# Patient Record
Sex: Male | Born: 1950
Health system: Southern US, Community
[De-identification: ages and names within clinical notes are randomized; demographics above are authoritative.]

## PROBLEM LIST (undated history)

## (undated) DIAGNOSIS — T8859XA Other complications of anesthesia, initial encounter: Secondary | ICD-10-CM

## (undated) DIAGNOSIS — N400 Enlarged prostate without lower urinary tract symptoms: Secondary | ICD-10-CM

## (undated) DIAGNOSIS — E7800 Pure hypercholesterolemia, unspecified: Secondary | ICD-10-CM

## (undated) DIAGNOSIS — H9319 Tinnitus, unspecified ear: Secondary | ICD-10-CM

## (undated) DIAGNOSIS — H43811 Vitreous degeneration, right eye: Secondary | ICD-10-CM

## (undated) DIAGNOSIS — G4733 Obstructive sleep apnea (adult) (pediatric): Secondary | ICD-10-CM

## (undated) DIAGNOSIS — J189 Pneumonia, unspecified organism: Secondary | ICD-10-CM

## (undated) DIAGNOSIS — Z87828 Personal history of other (healed) physical injury and trauma: Secondary | ICD-10-CM

## (undated) DIAGNOSIS — F988 Other specified behavioral and emotional disorders with onset usually occurring in childhood and adolescence: Secondary | ICD-10-CM

## (undated) DIAGNOSIS — G4730 Sleep apnea, unspecified: Secondary | ICD-10-CM

## (undated) DIAGNOSIS — M7541 Impingement syndrome of right shoulder: Secondary | ICD-10-CM

## (undated) DIAGNOSIS — I4891 Unspecified atrial fibrillation: Secondary | ICD-10-CM

## (undated) DIAGNOSIS — H9190 Unspecified hearing loss, unspecified ear: Secondary | ICD-10-CM

## (undated) DIAGNOSIS — R002 Palpitations: Secondary | ICD-10-CM

## (undated) DIAGNOSIS — N451 Epididymitis: Secondary | ICD-10-CM

## (undated) DIAGNOSIS — G5600 Carpal tunnel syndrome, unspecified upper limb: Secondary | ICD-10-CM

## (undated) DIAGNOSIS — Z9889 Other specified postprocedural states: Secondary | ICD-10-CM

## (undated) DIAGNOSIS — I499 Cardiac arrhythmia, unspecified: Secondary | ICD-10-CM

## (undated) HISTORY — DX: Tinnitus, unspecified ear: H93.19

## (undated) HISTORY — DX: Palpitations: R00.2

## (undated) HISTORY — DX: Other specified postprocedural states: Z98.890

## (undated) HISTORY — DX: Carpal tunnel syndrome, unspecified upper limb: G56.00

## (undated) HISTORY — DX: Other specified behavioral and emotional disorders with onset usually occurring in childhood and adolescence: F98.8

## (undated) HISTORY — DX: Sleep apnea, unspecified: G47.30

## (undated) HISTORY — DX: Pure hypercholesterolemia, unspecified: E78.00

## (undated) HISTORY — DX: Epididymitis: N45.1

## (undated) HISTORY — DX: Benign prostatic hyperplasia without lower urinary tract symptoms: N40.0

## (undated) HISTORY — DX: Unspecified atrial fibrillation: I48.91

## (undated) HISTORY — DX: Unspecified hearing loss, unspecified ear: H91.90

## (undated) HISTORY — DX: Vitreous degeneration, right eye: H43.811

## (undated) HISTORY — PX: OTHER SURGICAL HISTORY: SHX169

## (undated) HISTORY — DX: Impingement syndrome of right shoulder: M75.41

## (undated) HISTORY — DX: Personal history of other (healed) physical injury and trauma: Z87.828

---
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
## 1996-09-14 HISTORY — PX: HERNIA REPAIR: SHX51

## 1999-09-15 HISTORY — PX: HERNIA REPAIR: SHX51

## 1999-10-16 ENCOUNTER — Encounter (HOSPITAL_BASED_OUTPATIENT_CLINIC_OR_DEPARTMENT_OTHER): Payer: Self-pay | Admitting: General Surgery

## 1999-10-20 ENCOUNTER — Ambulatory Visit (HOSPITAL_COMMUNITY): Admission: RE | Admit: 1999-10-20 | Discharge: 1999-10-20 | Payer: Self-pay | Admitting: General Surgery

## 2001-03-02 ENCOUNTER — Encounter: Payer: Self-pay | Admitting: Emergency Medicine

## 2001-03-02 ENCOUNTER — Encounter (HOSPITAL_BASED_OUTPATIENT_CLINIC_OR_DEPARTMENT_OTHER): Payer: Self-pay | Admitting: General Surgery

## 2001-03-02 ENCOUNTER — Emergency Department (HOSPITAL_COMMUNITY): Admission: EM | Admit: 2001-03-02 | Discharge: 2001-03-02 | Payer: Self-pay | Admitting: Unknown Physician Specialty

## 2001-06-15 ENCOUNTER — Ambulatory Visit (HOSPITAL_COMMUNITY): Admission: RE | Admit: 2001-06-15 | Discharge: 2001-06-15 | Payer: Self-pay | Admitting: Gastroenterology

## 2004-08-14 ENCOUNTER — Ambulatory Visit: Payer: Self-pay | Admitting: Internal Medicine

## 2004-08-14 ENCOUNTER — Ambulatory Visit (HOSPITAL_BASED_OUTPATIENT_CLINIC_OR_DEPARTMENT_OTHER): Admission: RE | Admit: 2004-08-14 | Discharge: 2004-08-14 | Payer: Self-pay | Admitting: Internal Medicine

## 2004-10-02 ENCOUNTER — Ambulatory Visit (HOSPITAL_BASED_OUTPATIENT_CLINIC_OR_DEPARTMENT_OTHER): Admission: RE | Admit: 2004-10-02 | Discharge: 2004-10-02 | Payer: Self-pay | Admitting: Internal Medicine

## 2004-10-23 ENCOUNTER — Ambulatory Visit: Payer: Self-pay | Admitting: Internal Medicine

## 2011-08-15 HISTORY — PX: EYE SURGERY: SHX253

## 2011-08-17 ENCOUNTER — Encounter: Payer: Self-pay | Admitting: Internal Medicine

## 2011-09-09 NOTE — H&P (Signed)
George Gardner DOB: Oct 19, 1950   Chief Complaint: Right wrist pain  History of Present Illness The patient is a 60 year old male who presents today for follow up of their hand/wrist. The patient is being followed for their right wrist pain. Symptoms reported today include: pain, aching, weakness, numbness and burning. The patient feels that they are doing poorly and report their pain level to be moderate to severe. Current treatment includes: bracing and NSAIDs. The following medication has been used for pain control: antiinflammatory medication. The patient presents today following EMG/NCV. The patient has reported improvement of their symptoms with: bracing, conservative measures and NSAIDs. The patient indicates that they have questions or concerns today regarding pain. Note for "Follow-up Hand/Wrist": right and left wrists. His right wrist is the worse. He states he is dropping things all the time. He has weakness in his hands. He states he can push on a pressure point in the Rt. shoulder that will somewhat relieve some of the numbness in his first 3 fingers.   Family History Cerebrovascular Accident. mother Congestive Heart Failure. mother Heart Disease. mother   Social History Alcohol use. current drinker; drinks beer and wine; 8-14 per week Children. 2 Current work status. working full time Drug/Alcohol Rehab (Currently). no Drug/Alcohol Rehab (Previously). no Exercise. Exercises rarely; does other Illicit drug use. no Living situation. live with spouse Marital status. married Number of flights of stairs before winded. greater than 5 Pain Contract. no Tobacco use. former smoker   Medication History Ibuprofen (200MG  Capsule, 1 Oral as needed) Active.   Past Surgical History Inguinal Hernia Repair. open: left and multiple times Tonsillectomy   Other Problems Cardiac Arrhythmia Sleep Apnea   Review of Systems General:Not  Present- Chills, Fever, Night Sweats, Appetite Loss, Fatigue, Feeling sick, Weight Gain and Weight Loss. Skin:Not Present- Itching, Rash, Skin Color Changes, Ulcer, Psoriasis and Change in Hair or Nails. HEENT:Present- Sensitivity to light, Hearing problems and Ringing in the Ears. Not Present- Nose Bleed. Neck:Not Present- Swollen Glands and Neck Mass. Respiratory:Not Present- Snoring, Chronic Cough, Bloody sputum and Dyspnea. Cardiovascular:Present- Palpitations. Not Present- Shortness of Breath, Chest Pain, Swelling of Extremities and Leg Cramps. Gastrointestinal:Not Present- Bloody Stool, Heartburn, Abdominal Pain, Vomiting, Nausea and Incontinence of Stool. Male Genitourinary:Not Present- Blood in Urine, Frequency, Incontinence and Nocturia. Musculoskeletal:Not Present- Muscle Weakness, Muscle Pain, Joint Stiffness, Joint Swelling, Joint Pain and Back Pain. Neurological:Present- Tingling. Not Present- Numbness, Burning, Tremor, Headaches and Dizziness. Psychiatric:Not Present- Anxiety, Depression and Memory Loss. Endocrine:Not Present- Cold Intolerance, Heat Intolerance, Excessive hunger and Excessive Thirst. Hematology:Not Present- Abnormal Bleeding, Anemia, Blood Clots and Easy Bruising.   Vitals 08/20/2011 10:12 AM Weight: 166.13 lb Height: 70.5 in Body Surface Area: 1.94 m Body Mass Index: 23.5 kg/m Pulse: 84 (Regular) BP: 121/75 (Sitting, Left Arm, Standard)    Objective  Alert and oriented x 3. No acute distress. EOM intact. Cranial nerves grossly intact. Lungs clear to auscultation. Heart sounds normal. No murmurs. Regular, rate, and rhythm. Abdomen soft and nontender. On exam, he has atrophy of his right thenar eminencethat he does not have on the left and he is right handed. Numbness along the median nerve distribution. Decreased strength in the right compared to the left.   Dr. Darrelyn Hillock went back through his study from 2008, four years ago. He had  moderate to severe right carpal tunnel syndrome. He is able to get by he said without it, but now it is worse especially at night.   Assessment & Plan Syndrome,  carpal tunnel (354.0)   Plans  He needs a right carpal tunnel release. Dr. Darrelyn Hillock went over all the procedures on this and he needs to be put to sleep a short time, tourniquet. Complications are rare such as infection and recurrence of carpal tunnel. Dr. Darrelyn Hillock drew him a picture, showed him the book and explained the procedure.

## 2011-09-10 ENCOUNTER — Encounter (HOSPITAL_BASED_OUTPATIENT_CLINIC_OR_DEPARTMENT_OTHER): Payer: Self-pay | Admitting: *Deleted

## 2011-09-10 NOTE — Progress Notes (Signed)
To [facility] at 1045. Hg on arrival,will request resent Ekg at primary Md's office.npo after mn.

## 2011-09-11 ENCOUNTER — Encounter (HOSPITAL_BASED_OUTPATIENT_CLINIC_OR_DEPARTMENT_OTHER): Payer: Self-pay | Admitting: Anesthesiology

## 2011-09-11 ENCOUNTER — Ambulatory Visit (HOSPITAL_BASED_OUTPATIENT_CLINIC_OR_DEPARTMENT_OTHER)
Admission: RE | Admit: 2011-09-11 | Discharge: 2011-09-11 | Disposition: A | Discharge status: Home / Self Care | Payer: BC Managed Care – PPO | Source: Ambulatory Visit | Attending: Orthopedic Surgery | Admitting: Orthopedic Surgery

## 2011-09-11 ENCOUNTER — Encounter (HOSPITAL_BASED_OUTPATIENT_CLINIC_OR_DEPARTMENT_OTHER): Payer: Self-pay | Admitting: *Deleted

## 2011-09-11 ENCOUNTER — Ambulatory Visit (HOSPITAL_BASED_OUTPATIENT_CLINIC_OR_DEPARTMENT_OTHER): Payer: BC Managed Care – PPO | Admitting: Anesthesiology

## 2011-09-11 ENCOUNTER — Encounter (HOSPITAL_BASED_OUTPATIENT_CLINIC_OR_DEPARTMENT_OTHER)
Admission: RE | Disposition: A | Discharge status: Home / Self Care | Payer: Self-pay | Source: Ambulatory Visit | Attending: Orthopedic Surgery

## 2011-09-11 ENCOUNTER — Other Ambulatory Visit: Payer: Self-pay

## 2011-09-11 DIAGNOSIS — G5601 Carpal tunnel syndrome, right upper limb: Secondary | ICD-10-CM | POA: Diagnosis present

## 2011-09-11 DIAGNOSIS — M25539 Pain in unspecified wrist: Secondary | ICD-10-CM | POA: Insufficient documentation

## 2011-09-11 DIAGNOSIS — G4730 Sleep apnea, unspecified: Secondary | ICD-10-CM | POA: Insufficient documentation

## 2011-09-11 DIAGNOSIS — R209 Unspecified disturbances of skin sensation: Secondary | ICD-10-CM | POA: Insufficient documentation

## 2011-09-11 DIAGNOSIS — I499 Cardiac arrhythmia, unspecified: Secondary | ICD-10-CM | POA: Insufficient documentation

## 2011-09-11 DIAGNOSIS — G5600 Carpal tunnel syndrome, unspecified upper limb: Secondary | ICD-10-CM | POA: Insufficient documentation

## 2011-09-11 HISTORY — DX: Cardiac arrhythmia, unspecified: I49.9

## 2011-09-11 HISTORY — DX: Obstructive sleep apnea (adult) (pediatric): G47.33

## 2011-09-11 HISTORY — PX: CARPAL TUNNEL RELEASE: SHX101

## 2011-09-11 SURGERY — CARPAL TUNNEL RELEASE
Anesthesia: General | Site: Hand | Laterality: Right | Wound class: Clean

## 2011-09-11 MED ORDER — OXYCODONE-ACETAMINOPHEN 5-325 MG PO TABS
1.0000 | ORAL_TABLET | ORAL | Status: DC | PRN
  Administered 2011-09-11: 1 via ORAL

## 2011-09-11 MED ORDER — BACITRACIN-NEOMYCIN-POLYMYXIN 400-5-5000 EX OINT
TOPICAL_OINTMENT | CUTANEOUS | Status: DC | PRN
  Administered 2011-09-11: 1 via TOPICAL

## 2011-09-11 MED ORDER — LACTATED RINGERS IV SOLN
INTRAVENOUS | Status: DC
  Administered 2011-09-11: 100 mL/h via INTRAVENOUS

## 2011-09-11 MED ORDER — DEXAMETHASONE SODIUM PHOSPHATE 4 MG/ML IJ SOLN
INTRAMUSCULAR | Status: DC | PRN
  Administered 2011-09-11: 10 mg via INTRAVENOUS

## 2011-09-11 MED ORDER — PROPOFOL 10 MG/ML IV EMUL
INTRAVENOUS | Status: DC | PRN
  Administered 2011-09-11: 200 mg via INTRAVENOUS

## 2011-09-11 MED ORDER — MIDAZOLAM HCL 5 MG/5ML IJ SOLN
INTRAMUSCULAR | Status: DC | PRN
  Administered 2011-09-11 (×2): 1 mg via INTRAVENOUS

## 2011-09-11 MED ORDER — LACTATED RINGERS IV SOLN: INTRAVENOUS | Status: DC

## 2011-09-11 MED ORDER — KETOROLAC TROMETHAMINE 30 MG/ML IJ SOLN
INTRAMUSCULAR | Status: DC | PRN
  Administered 2011-09-11: 30 mg via INTRAVENOUS

## 2011-09-11 MED ORDER — FENTANYL CITRATE 0.05 MG/ML IJ SOLN
INTRAMUSCULAR | Status: DC | PRN
  Administered 2011-09-11 (×2): 25 ug via INTRAVENOUS
  Administered 2011-09-11: 75 ug via INTRAVENOUS
  Administered 2011-09-11: 25 ug via INTRAVENOUS
  Administered 2011-09-11: 50 ug via INTRAVENOUS

## 2011-09-11 MED ORDER — PROMETHAZINE HCL 25 MG/ML IJ SOLN: 6.2500 mg | INTRAMUSCULAR | Status: DC | PRN

## 2011-09-11 MED ORDER — LIDOCAINE HCL (CARDIAC) 20 MG/ML IV SOLN
INTRAVENOUS | Status: DC | PRN
  Administered 2011-09-11: 100 mg via INTRAVENOUS

## 2011-09-11 MED ORDER — ONDANSETRON HCL 4 MG/2ML IJ SOLN
INTRAMUSCULAR | Status: DC | PRN
  Administered 2011-09-11: 4 mg via INTRAVENOUS

## 2011-09-11 SURGICAL SUPPLY — 44 items
BANDAGE ELASTIC 3 VELCRO ST LF (GAUZE/BANDAGES/DRESSINGS) ×2 IMPLANT
BANDAGE GAUZE ELAST BULKY 4 IN (GAUZE/BANDAGES/DRESSINGS) ×2 IMPLANT
BENZOIN TINCTURE PRP APPL 2/3 (GAUZE/BANDAGES/DRESSINGS) IMPLANT
BLADE SURG 15 STRL LF DISP TIS (BLADE) ×2 IMPLANT
BLADE SURG 15 STRL SS (BLADE) ×2
BNDG ESMARK 4X9 LF (GAUZE/BANDAGES/DRESSINGS) ×2 IMPLANT
CLOTH BEACON ORANGE TIMEOUT ST (SAFETY) ×2 IMPLANT
CORDS BIPOLAR (ELECTRODE) ×2 IMPLANT
COVER MAYO STAND STRL (DRAPES) IMPLANT
COVER TABLE BACK 60X90 (DRAPES) ×2 IMPLANT
DRAPE EXTREMITY T 121X128X90 (DRAPE) ×2 IMPLANT
DRAPE LG THREE QUARTER DISP (DRAPES) ×2 IMPLANT
DRSG ADAPTIC 3X8 NADH LF (GAUZE/BANDAGES/DRESSINGS) ×2 IMPLANT
DRSG PAD ABDOMINAL 8X10 ST (GAUZE/BANDAGES/DRESSINGS) ×2 IMPLANT
DURAPREP 26ML APPLICATOR (WOUND CARE) ×2 IMPLANT
ELECT REM PT RETURN 9FT ADLT (ELECTROSURGICAL) ×2
ELECTRODE REM PT RTRN 9FT ADLT (ELECTROSURGICAL) ×1 IMPLANT
GAUZE KERLIX 2  STERILE LF (GAUZE/BANDAGES/DRESSINGS) ×2 IMPLANT
GLOVE BIO SURGEON STRL SZ8.5 (GLOVE) ×2 IMPLANT
GLOVE BIOGEL M 6.5 STRL (GLOVE) ×2 IMPLANT
GLOVE ECLIPSE 6.0 STRL STRAW (GLOVE) ×2 IMPLANT
GLOVE INDICATOR 8.5 STRL (GLOVE) ×2 IMPLANT
GLOVE SURG ORTHO 6.0 STRL STRW (GLOVE) ×2 IMPLANT
GOWN W/2 COTTON TOWELS 2 STD (GOWNS) ×2 IMPLANT
NEEDLE HYPO 22GX1.5 SAFETY (NEEDLE) ×2 IMPLANT
NS IRRIG 500ML POUR BTL (IV SOLUTION) ×2 IMPLANT
PACK BASIN DAY SURGERY FS (CUSTOM PROCEDURE TRAY) ×2 IMPLANT
PADDING CAST ABS 4INX4YD NS (CAST SUPPLIES) ×1
PADDING CAST ABS COTTON 4X4 ST (CAST SUPPLIES) ×1 IMPLANT
PENCIL BUTTON HOLSTER BLD 10FT (ELECTRODE) IMPLANT
SPONGE GAUZE 4X4 12PLY (GAUZE/BANDAGES/DRESSINGS) ×2 IMPLANT
STOCKINETTE 4X48 STRL (DRAPES) ×2 IMPLANT
STRIP CLOSURE SKIN 1/2X4 (GAUZE/BANDAGES/DRESSINGS) IMPLANT
SUT ETHILON 3 0 PS 1 (SUTURE) ×4 IMPLANT
SUT VIC AB 1 CT1 36 (SUTURE) IMPLANT
SUT VIC AB 3-0 FS2 27 (SUTURE) IMPLANT
SUT VIC AB 3-0 PS1 18 (SUTURE)
SUT VIC AB 3-0 PS1 18XBRD (SUTURE) IMPLANT
SUT VICRYL 4-0 PS2 18IN ABS (SUTURE) IMPLANT
SYR BULB IRRIGATION 50ML (SYRINGE) ×2 IMPLANT
SYR CONTROL 10ML LL (SYRINGE) IMPLANT
TOWEL OR 17X24 6PK STRL BLUE (TOWEL DISPOSABLE) ×4 IMPLANT
UNDERPAD 30X30 INCONTINENT (UNDERPADS AND DIAPERS) ×2 IMPLANT
WATER STERILE IRR 500ML POUR (IV SOLUTION) ×2 IMPLANT

## 2011-09-11 NOTE — Anesthesia Procedure Notes (Signed)
Procedure Name: LMA Insertion Date/Time: 09/11/2011 1:14 PM Performed by: Huel Coventry Pre-anesthesia Checklist: Patient identified, Emergency Drugs available, Suction available and Patient being monitored Patient Re-evaluated:Patient Re-evaluated prior to inductionOxygen Delivery Method: Circle System Utilized Preoxygenation: Pre-oxygenation with 100% oxygen Intubation Type: IV induction Ventilation: Mask ventilation without difficulty LMA: LMA inserted LMA Size: 4.0 Number of attempts: 1 Airway Equipment and Method: bite block Placement Confirmation: positive ETCO2 Tube secured with: Tape Dental Injury: Teeth and Oropharynx as per pre-operative assessment

## 2011-09-11 NOTE — Anesthesia Postprocedure Evaluation (Signed)
  Anesthesia Post-op Note  Patient: George Gardner  Procedure(s) Performed:  CARPAL TUNNEL RELEASE  Patient Location: PACU  Anesthesia Type: General  Level of Consciousness: awake and alert   Airway and Oxygen Therapy: Patient Spontanous Breathing  Post-op Pain: mild  Post-op Assessment: Post-op Vital signs reviewed, Patient's Cardiovascular Status Stable, Respiratory Function Stable, Patent Airway and No signs of Nausea or vomiting  Post-op Vital Signs: stable  Complications: No apparent anesthesia complications

## 2011-09-11 NOTE — H&P (View-Only) (Signed)
To [facility] at 1045. Hg on arrival,will request resent Ekg at primary Md's office.npo after mn. 

## 2011-09-11 NOTE — Interval H&P Note (Signed)
History and Physical Interval Note:  09/11/2011 1:01 PM  George Gardner  has presented today for surgery, with the diagnosis of RT CARPAL TUNNEL SYNDROME  The various methods of treatment have been discussed with the patient and family. After consideration of risks, benefits and other options for treatment, the patient has consented to  Procedure(s): CARPAL TUNNEL RELEASE as a surgical intervention .  The patients' history has been reviewed, patient examined, no change in status, stable for surgery.  I have reviewed the patients' chart and labs.  Questions were answered to the patient's satisfaction.     Erhardt Dada A

## 2011-09-11 NOTE — Brief Op Note (Signed)
09/11/2011  2:19 PM  PATIENT:  George Gardner  60 y.o. male  PRE-OPERATIVE DIAGNOSIS:  RT CARPAL TUNNEL SYNDROME  POST-OPERATIVE DIAGNOSIS:  right carpal tunnel syndrome  PROCEDURE:  Procedure(s): CARPAL TUNNEL RELEASE  SURGEON:  Surgeon(s): Eriko Economos A Calogero Geisen  PHYSICIAN ASSISTANT:   ASSISTANTS: Nurse   ANESTHESIA:   general  EBL:  Total I/O In: 800 [I.V.:800] Out: -   BLOOD ADMINISTERED:none  DRAINS: none   LOCAL MEDICATIONS USED:  NONE  SPECIMEN:  No Specimen  DISPOSITION OF SPECIMEN:  N/A  COUNTS:  YES  TOURNIQUET:   Total Tourniquet Time Documented: Upper Arm (Right) - 22 minutes  DICTATION: .Other Dictation: Dictation Number [id number]  PLAN OF CARE: Discharge to home after PACU  PATIENT DISPOSITION:  PACU - hemodynamically stable.   Delay start of Pharmacological VTE agent (>24hrs) due to surgical blood loss or risk of bleeding:  {YES/NO/NOT APPLICABLE:20182

## 2011-09-11 NOTE — Transfer of Care (Signed)
Immediate Anesthesia Transfer of Care Note  Patient: George Gardner  Procedure(s) Performed:  CARPAL TUNNEL RELEASE  Patient Location: PACU  Anesthesia Type: General  Level of Consciousness: patient drowsy, follows commands and arouses to name.  Airway & Oxygen Therapy: Patient Spontanous Breathing and Patient connected to face mask oxygen  Post-op Assessment: Report given to PACU RN and Post -op Vital signs reviewed and stable  Post vital signs: Reviewed and stable  Complications: No apparent anesthesia complications

## 2011-09-11 NOTE — Anesthesia Preprocedure Evaluation (Addendum)
Anesthesia Evaluation  Patient identified by MRN, date of birth, ID band Patient awake    Reviewed: Allergy & Precautions, H&P , NPO status , Patient's Chart, lab work & pertinent test results  Airway Mallampati: II TM Distance: >3 FB Neck ROM: full    Dental No notable dental hx. (+) Teeth Intact and Dental Advisory Given   Pulmonary neg pulmonary ROS, sleep apnea ,  clear to auscultation  Pulmonary exam normal       Cardiovascular Exercise Tolerance: Good neg cardio ROS Dysrhythmias: hereditary arrythmia if stressed out or too much caffeine.  irregular like A fib.  Hits himself in chest and it  stops. regular Normal    Neuro/Psych Negative Neurological ROS  Negative Psych ROS   GI/Hepatic negative GI ROS, Neg liver ROS,   Endo/Other  Negative Endocrine ROS  Renal/GU negative Renal ROS  Genitourinary negative   Musculoskeletal   Abdominal   Peds  Hematology negative hematology ROS (+)   Anesthesia Other Findings   Reproductive/Obstetrics negative OB ROS                          Anesthesia Physical Anesthesia Plan  ASA: III  Anesthesia Plan: General   Post-op Pain Management:    Induction: Intravenous  Airway Management Planned: LMA  Additional Equipment:   Intra-op Plan:   Post-operative Plan:   Informed Consent: I have reviewed the patients History and Physical, chart, labs and discussed the procedure including the risks, benefits and alternatives for the proposed anesthesia with the patient or authorized representative who has indicated his/her understanding and acceptance.   Dental Advisory Given  Plan Discussed with: CRNA and Surgeon  Anesthesia Plan Comments:         Anesthesia Quick Evaluation

## 2011-09-14 ENCOUNTER — Encounter (HOSPITAL_BASED_OUTPATIENT_CLINIC_OR_DEPARTMENT_OTHER): Payer: Self-pay | Admitting: Orthopedic Surgery

## 2011-09-14 LAB — POCT HEMOGLOBIN-HEMACUE: Hemoglobin: 14.4 g/dL (ref 13.0–17.0)

## 2011-09-16 NOTE — Op Note (Signed)
NAMEMARVELLE, CAUDILL               ACCOUNT NO.:  [id number]  MEDICAL RECORD NO.:  [id number]  LOCATION:                                 FACILITY:  PHYSICIAN:  Georges Lynch. Darrelyn Hillock, M.D.     DATE OF BIRTH:  DATE OF PROCEDURE:  09/11/2011 DATE OF DISCHARGE:                              OPERATIVE REPORT   SURGEON:  Georges Lynch. Darrelyn Hillock, MD  ASSISTANT:  Nurse.  PREOPERATIVE DIAGNOSIS:  Severe carpal tunnel syndrome on the right.  POSTOPERATIVE DIAGNOSES: 1. Severe carpal tunnel syndrome on the right. 2. Previous infection of the palm of his right hand in the 1990s.  He     has had no signs of any recurrent infection.  OPERATION:  Right carpal tunnel release.  PROCEDURE IN DETAIL:  Under general anesthesia, routine orthopedic prepping and draping of the right upper extremity was carried out.  The appropriate time-out was carried out prior to incision and I also marked the appropriate right arm in the holding area.  The right arm was exsanguinated with an Esmarch, tourniquet was elevated to 225 mmHg.  A curvilinear incision was made over the palmar aspect of the right hand, bleeders identified, and cauterized with a bipolar.  Great care was taken not to injure the underlying neurovascular structures.  I identified the deep and superficial transverse carpal ligaments.  I identified the palmaris longus and a small incision was made in the transverse ligament.  I then protected the nerve with a Freer and incised the ligament all the way down in the palm, the nerve now was totally free.  The only abnormality in the nerve was the distal part of the nerve was somewhat yellow in color.  The proximal part as across the wrist was more white, I am not sure if that because of the chronicity of his carpal tunnel syndrome or had any side effects from the previous infection.  He certainly had absolutely no signs of any infection at this time.  I thoroughly irrigated out the area and closed the skin  only with 3-0 nylon suture.  Bundle dressing was applied.  He had 1 g of IV Ancef preop.  Postop, I am going to put him on some prophylactic doxycycline, since he is allergic to penicillin, 100 mg b.i.d., and I will have 1 Percocet 5/325 for pain.  He will see me in the office Monday for dressing change or prior to if there is a problem.  I did explain his wife postop that there could be an issue with an infection because he still could be a carrier and we need to watch that.          ______________________________ Georges Lynch. Darrelyn Hillock, M.D.     RAG/MEDQ  D:  09/11/2011  T:  09/11/2011  Job:  [id number]

## 2011-10-16 HISTORY — PX: COLONOSCOPY: SHX174

## 2012-05-16 ENCOUNTER — Encounter: Payer: Self-pay | Admitting: Internal Medicine

## 2012-05-17 ENCOUNTER — Encounter: Payer: Self-pay | Admitting: Internal Medicine

## 2012-06-14 ENCOUNTER — Ambulatory Visit (INDEPENDENT_AMBULATORY_CARE_PROVIDER_SITE_OTHER): Payer: BC Managed Care – PPO | Admitting: Internal Medicine

## 2012-06-14 ENCOUNTER — Encounter: Payer: Self-pay | Admitting: Internal Medicine

## 2012-06-14 VITALS — BP 148/91 | HR 70 | Ht 70.0 in | Wt 168.0 lb

## 2012-06-14 DIAGNOSIS — R55 Syncope and collapse: Secondary | ICD-10-CM | POA: Insufficient documentation

## 2012-06-14 DIAGNOSIS — I4891 Unspecified atrial fibrillation: Secondary | ICD-10-CM | POA: Insufficient documentation
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
## 2012-06-14 NOTE — Patient Instructions (Addendum)
Your physician recommends that you schedule a follow-up appointment in: 4 months with Dr Taylor  

## 2012-06-14 NOTE — Assessment & Plan Note (Signed)
The patient does not have definitive documentation of atrial fibrillation at least not that I can access. His history very strongly suggests that in fact he does have atrial fibrillation. We discussed the treatment options in detail. One option would be to have the patient wear another cardiac monitor. The limitation of this is that he may not have any additional arrhythmias. A second option would be to strongly curtail his alcohol consumption. The patient admits to drinking more alcohol than usual and on the day of his episode, drink an extremely large amount. In addition he was dehydrated from being out in the sun most of the day. This may represent a vagal type of atrial fibrillation. Another option would be to have him take an antiarrhythmic drug like flecainide. Finally because of the syncope, implantable loop recorder to be a consideration. After discussing the pros and cons of all the above options, I recommended that he curtail his alcohol consumption completely or at least limit his alcohol intake to less than or equal to 1 drink per day. If he has recurrent symptomatic arrhythmias, then insertion of an implantable loop for in the possible initiation of an antiarrhythmic drug would be warranted. We'll undergo watchful waiting for now.

## 2012-08-22 ENCOUNTER — Encounter: Payer: Self-pay | Admitting: Internal Medicine

## 2012-10-19 ENCOUNTER — Encounter: Payer: Self-pay | Admitting: Internal Medicine

## 2012-10-19 ENCOUNTER — Ambulatory Visit (INDEPENDENT_AMBULATORY_CARE_PROVIDER_SITE_OTHER): Payer: BC Managed Care – PPO | Admitting: Internal Medicine

## 2012-10-19 VITALS — BP 148/83 | HR 82 | Wt 167.1 lb

## 2012-10-19 DIAGNOSIS — R55 Syncope and collapse: Secondary | ICD-10-CM

## 2012-10-19 DIAGNOSIS — I4891 Unspecified atrial fibrillation: Secondary | ICD-10-CM

## 2012-10-19 NOTE — Assessment & Plan Note (Signed)
We still had no clear documentation of atrial fibrillation. We discussed in detail the different options for diagnostic evaluation and treatment. Because his symptoms have been improved over the last several months despite excess alcohol use, I recommended a period of watchful waiting. He is instructed to call us if he has any worsening of his symptoms. We discussed the possibility of wine a cardiac monitor versus a loop recorder for additional evaluation.

## 2012-10-19 NOTE — Assessment & Plan Note (Signed)
He has had no recurrent syncope. For this reason and those noted previously, he will undergo watchful waiting.

## 2012-10-19 NOTE — Patient Instructions (Addendum)
Your physician wants you to follow-up in: 6-8 months with Dr. Ladona Ridgel.  You will receive a reminder letter in the mail two months in advance. If you don't receive a letter, please call our office to schedule the follow-up appointment.

## 2012-10-19 NOTE — Progress Notes (Signed)
HPI Mr. Mesta returns today for followup. He is a very pleasant 62 year old man with a history of palpitations, thought to be due to atrial fibrillation, though never well documented. When I saw him last several months ago, he admitted to indiscretion with alcohol, particularly red wine. I encouraged the patient to reduce his wine consumption. In the interim, his palpitations are much improved. He does occasionally drink more wine than he should, 2-3 glasses at night on occasion. He denies syncope or near-syncope. No chest pain or shortness of breath. Allergies  Allergen Reactions  . Codeine Other (See Comments)    disoriented  . Penicillins Hives     Current Outpatient Prescriptions  Medication Sig Dispense Refill  . aspirin 81 MG tablet Take 81 mg by mouth daily.      Marland Kitchen econazole nitrate 1 % cream       . fluticasone (FLONASE) 50 MCG/ACT nasal spray          Past Medical History  Diagnosis Date  . Dysrhythmia     never identified with halter monitor  . OSA (obstructive sleep apnea)     mild-didn't tolerate cpap    ROS:   All systems reviewed and negative except as noted in the HPI.   Past Surgical History  Procedure Date  . Eye surgery 12/12    rt cataract removal w iol implant  . Hernia repair 2001  . Carpal tunnel release 09/11/2011    Procedure: CARPAL TUNNEL RELEASE;  Surgeon: Jacki Cones;  Location: [facility];  Service: Orthopedics;  Laterality: Right;     No family history on file.   History   Social History  . Marital Status: Married    Spouse Name: N/A    Number of Children: N/A  . Years of Education: N/A   Occupational History  . Not on file.   Social History Main Topics  . Smoking status: Former Smoker    Quit date: 09/10/2007  . Smokeless tobacco: Not on file  . Alcohol Use: 0.6 oz/week    1 Glasses of wine per week  . Drug Use: No  . Sexually Active:    Other Topics Concern  . Not on file   Social History  Narrative  . No narrative on file     BP 148/83  Pulse 82  Wt 167 lb 1.9 oz (75.805 kg)  Physical Exam:  Well appearing middle-aged man, NAD HEENT: Unremarkable Neck:  No JVD, no thyromegally Lungs:  Clear with no wheezes, rales, or rhonchi. HEART:  Regular rate rhythm, no murmurs, no rubs, no clicks Abd:  soft, positive bowel sounds, no organomegally, no rebound, no guarding Ext:  2 plus pulses, no edema, no cyanosis, no clubbing Skin:  No rashes no nodules Neuro:  CN II through XII intact, motor grossly intact    Assess/Plan:

## 2014-02-26 ENCOUNTER — Ambulatory Visit
Admission: RE | Admit: 2014-02-26 | Discharge: 2014-02-26 | Disposition: A | Discharge status: Home / Self Care | Payer: 59 | Source: Ambulatory Visit | Attending: Chiropractic Medicine | Admitting: Chiropractic Medicine

## 2014-02-26 ENCOUNTER — Other Ambulatory Visit: Payer: Self-pay | Admitting: Chiropractic Medicine

## 2014-02-26 DIAGNOSIS — M25519 Pain in unspecified shoulder: Secondary | ICD-10-CM

## 2014-02-26 DIAGNOSIS — M542 Cervicalgia: Secondary | ICD-10-CM

## 2014-10-30 ENCOUNTER — Ambulatory Visit
Admission: RE | Admit: 2014-10-30 | Discharge: 2014-10-30 | Disposition: A | Discharge status: Home / Self Care | Payer: Self-pay | Source: Ambulatory Visit | Attending: Family Medicine | Admitting: Family Medicine

## 2014-10-30 ENCOUNTER — Other Ambulatory Visit: Payer: Self-pay | Admitting: Family Medicine

## 2014-10-30 DIAGNOSIS — R059 Cough, unspecified: Secondary | ICD-10-CM

## 2014-10-30 DIAGNOSIS — R05 Cough: Secondary | ICD-10-CM

## 2015-06-11 ENCOUNTER — Ambulatory Visit: Payer: 59 | Admitting: Internal Medicine

## 2015-07-04 ENCOUNTER — Encounter: Payer: Self-pay | Admitting: Internal Medicine

## 2015-07-09 ENCOUNTER — Encounter: Payer: Self-pay | Admitting: Internal Medicine

## 2015-07-09 ENCOUNTER — Ambulatory Visit (INDEPENDENT_AMBULATORY_CARE_PROVIDER_SITE_OTHER): Payer: 59 | Admitting: Internal Medicine

## 2015-07-09 VITALS — BP 142/80 | HR 79 | Ht 70.0 in | Wt 171.0 lb

## 2015-07-09 DIAGNOSIS — I480 Paroxysmal atrial fibrillation: Secondary | ICD-10-CM | POA: Diagnosis not present

## 2015-07-09 DIAGNOSIS — R55 Syncope and collapse: Secondary | ICD-10-CM | POA: Diagnosis not present

## 2015-07-09 NOTE — Progress Notes (Signed)
HPI George Gardner returns today after a 3 year absence from our arrhythmia clinic. 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 The patient was seen by me for what was thought to be atrial fib (palpitations) and we discussed possible treatment options. He had previously consumed ETOH in excess and I asked him to reduce his intake of ETOH. He has done well in the interim noting very rare and brief palpitations. The patient is on no medications. He has not had syncope or near syncope. He is taking multiple supplements and denies any caffeine intake. Allergies  Allergen Reactions  . Asa [Aspirin]     Severe ringing of the ears  . Codeine Other (See Comments)    disoriented  . Penicillins Hives     Current Outpatient Prescriptions  Medication Sig Dispense Refill  . ibuprofen (ADVIL,MOTRIN) 200 MG tablet Take 200 mg by mouth every 6 (six) hours as needed (pain).     No current facility-administered medications for this visit.     Past Medical History  Diagnosis Date  . Dysrhythmia     never identified with halter monitor  . OSA (obstructive sleep apnea)     mild-didn't tolerate cpap  . Attention deficit disorder   . Hypercholesteremia   . Sleep apnea   . Epididymitis   . Palpitations   . Atrial fibrillation ([place])   . Vitreous detachment of right eye   . H/O eye surgery   . Carpal tunnel syndrome   . Tinnitus   . Hearing loss   . History of torn meniscus of left knee   . First detected episode of atrial fibrillation ([place])     ROS:   All systems reviewed and negative except as noted in the HPI.   Past Surgical History  Procedure Laterality Date  . Eye surgery  12/12    rt cataract removal w iol implant  . Hernia repair  2001  . Carpal tunnel release  09/11/2011    Procedure: CARPAL TUNNEL RELEASE;  Surgeon: Tobi Bastos;  Location: [facility];  Service: Orthopedics;  Laterality: Right;  . Tonsillectomy  1961  . Foot surgery  1971    . Colonoscopy  10/16/2011  . Yag laser surgery       Family History  Problem Relation Age of Onset  . Other Father     sepsis  . Arrhythmia Mother   . Thyroid disease Mother   . Other Mother     thyroidectomy  . Transient ischemic attack Mother   . Congestive Heart Failure Mother   . Osteoarthritis Brother     knees  . Other Brother     back pain  . Other Sister     pacemaker,hip replacement  . Colon cancer Paternal Uncle   . Heart disease Maternal Aunt     CHF, colon cancer     Social History   Social History  . Marital Status: Married    Spouse Name: N/A  . Number of Children: N/A  . Years of Education: N/A   Occupational History  . Not on file.   Social History Main Topics  . Smoking status: Former Smoker    Quit date: 03/03/2008  . Smokeless tobacco: Not on file  . Alcohol Use: 0.6 oz/week    1 Glasses of wine per week  . Drug Use: No  . Sexual Activity: Not on file   Other Topics Concern  . Not on  file   Social History Narrative   Married , 2 children     BP 142/80 mmHg  Pulse 79  Ht 5\' 10"  (1.778 m)  Wt 171 lb (77.565 kg)  BMI 24.54 kg/m2  Physical Exam:  Well appearing 64 yo man, NAD HEENT: Unremarkable Neck:  6 cm JVD, no thyromegally Lymphatics:  No adenopathy Back:  No CVA tenderness Lungs:  Clear with no wheezes HEART:  Regular rate rhythm, no murmurs, no rubs, no clicks Abd:  soft, positive bowel sounds, no organomegally, no rebound, no guarding Ext:  2 plus pulses, no edema, no cyanosis, no clubbing Skin:  No rashes no nodules Neuro:  CN II through XII intact, motor grossly intact  EKG - nsr with a PVC  Assess/Plan:

## 2015-07-09 NOTE — Assessment & Plan Note (Signed)
His CHADSVASC score is 0-1. He has white coat HTN. He is essentially asymptomatic. He will undergo watchful waiting. I have asked him to call if he develops sustained atrial fib. I have asked him to moderate further his ETOH consumption to 1-2 drinks a day.

## 2015-07-09 NOTE — Assessment & Plan Note (Signed)
No recurrent episodes. 

## 2015-07-09 NOTE — Patient Instructions (Signed)

## 2015-08-20 ENCOUNTER — Ambulatory Visit (HOSPITAL_BASED_OUTPATIENT_CLINIC_OR_DEPARTMENT_OTHER): Payer: 59 | Attending: Otolaryngology | Admitting: Radiology

## 2015-08-20 DIAGNOSIS — G4736 Sleep related hypoventilation in conditions classified elsewhere: Secondary | ICD-10-CM | POA: Diagnosis not present

## 2015-08-20 DIAGNOSIS — R0683 Snoring: Secondary | ICD-10-CM | POA: Insufficient documentation

## 2015-08-20 DIAGNOSIS — R682 Dry mouth, unspecified: Secondary | ICD-10-CM

## 2015-08-20 DIAGNOSIS — R451 Restlessness and agitation: Secondary | ICD-10-CM

## 2015-08-20 DIAGNOSIS — G4733 Obstructive sleep apnea (adult) (pediatric): Secondary | ICD-10-CM | POA: Diagnosis present

## 2015-08-24 DIAGNOSIS — R451 Restlessness and agitation: Secondary | ICD-10-CM | POA: Diagnosis not present

## 2015-08-24 DIAGNOSIS — R0683 Snoring: Secondary | ICD-10-CM | POA: Diagnosis not present

## 2015-08-24 DIAGNOSIS — G4733 Obstructive sleep apnea (adult) (pediatric): Secondary | ICD-10-CM | POA: Diagnosis not present

## 2015-08-24 DIAGNOSIS — R682 Dry mouth, unspecified: Secondary | ICD-10-CM | POA: Diagnosis not present

## 2015-08-24 NOTE — Progress Notes (Signed)
   Patient Name: George Gardner, George Gardner Date: 08/20/2015 Gender: Male D.O.B: 1950/09/15 Age (years): 64 Referring Provider: Izora Gala Height (inches): 40 Interpreting Physician: Baird Lyons MD, ABSM Weight (lbs): 166 RPSGT: Jacolyn Reedy BMI: 24 MRN: [id number] Neck Size: 15.50 CLINICAL INFORMATION Sleep Study Type: Unattended home sleep test   Indication for sleep study: OSA   Epworth Sleepiness Score: 7 SLEEP STUDY TECHNIQUE A multi-channel overnight portable sleep study was performed. The channels recorded were: nasal airflow, thoracic respiratory movement, and oxygen saturation with a pulse oximetry. Snoring was also monitored. MEDICATIONS Patient self administered medications include: charted for review.  SLEEP ARCHITECTURE Patient was studied for 321.7 minutes. The sleep efficiency was 97.9 % and the patient was supine for 89.7%. The arousal index was 0.0 per hour.  RESPIRATORY PARAMETERS The overall AHI was 61.5 per hour, with a central apnea index of 2.8 per hour. The oxygen nadir was 70% during sleep. Mean oxygen saturation 86%   CARDIAC DATA Mean heart rate during sleep was 68.6 bpm.  IMPRESSIONS - Severe obstructive sleep apnea occurred during this study (AHI = 61.5/h). - No significant central sleep apnea occurred during this study (CAI = 2.8/h). - Severe oxygen desaturation was noted during this study (Min O2 = 70%, Mean 86%). - Patient snored during 75.1% of sleep time.  DIAGNOSIS - Obstructive Sleep Apnea (327.23 [G47.33 ICD-10]) - Nocturnal Hypoxemia (327.26 [G47.36 ICD-10])  RECOMMENDATIONS - CPAP titration to determine optimal pressure for management - Positional therapy avoiding supine position during sleep. - Avoid alcohol, sedatives and other CNS depressants that may worsen sleep apnea and disrupt normal sleep architecture. - Sleep hygiene should be reviewed to assess factors that may improve sleep quality. - Weight management and  regular exercise should be initiated or continued.  Deneise Lever Diplomate, American Board of Sleep Medicine  ELECTRONICALLY SIGNED ON:  08/24/2015, 10:09 AM [place] PH: [phone]   FX: [phone] [place]

## 2015-09-05 ENCOUNTER — Ambulatory Visit (HOSPITAL_BASED_OUTPATIENT_CLINIC_OR_DEPARTMENT_OTHER): Payer: 59 | Attending: Otolaryngology

## 2015-09-05 VITALS — Ht 70.0 in | Wt 171.0 lb

## 2015-09-05 DIAGNOSIS — G4730 Sleep apnea, unspecified: Secondary | ICD-10-CM | POA: Insufficient documentation

## 2015-09-05 DIAGNOSIS — G4731 Primary central sleep apnea: Secondary | ICD-10-CM | POA: Insufficient documentation

## 2015-09-05 DIAGNOSIS — G4733 Obstructive sleep apnea (adult) (pediatric): Secondary | ICD-10-CM | POA: Diagnosis not present

## 2015-09-05 DIAGNOSIS — I493 Ventricular premature depolarization: Secondary | ICD-10-CM | POA: Diagnosis not present

## 2015-09-11 ENCOUNTER — Ambulatory Visit (HOSPITAL_BASED_OUTPATIENT_CLINIC_OR_DEPARTMENT_OTHER): Payer: 59

## 2015-09-15 DIAGNOSIS — G4730 Sleep apnea, unspecified: Secondary | ICD-10-CM

## 2015-09-15 NOTE — Progress Notes (Signed)
   Patient Name: George Gardner, George Gardner Date: 09/05/2015 Gender: Male D.O.B: February 20, 1951 Age (years): 64 Referring Provider: Izora Gala Height (inches): 70 Interpreting Physician: Baird Lyons MD, ABSM Weight (lbs): 171 RPSGT: Baxter Flattery BMI: 25 MRN: [id number] Neck Size: 15.50 CLINICAL INFORMATION The patient is referred for a CPAP titration to treat sleep apnea.   Date of NPSG, Split Night or HST: Diagnostic NPSG 08/20/15  AHI 61.5/ hr, desat to 70%, body weight 166 lbs SLEEP STUDY TECHNIQUE As per the AASM Manual for the Scoring of Sleep and Associated Events v2.3 (April 2016) with a hypopnea requiring 4% desaturations. The channels recorded and monitored were frontal, central and occipital EEG, electrooculogram (EOG), submentalis EMG (chin), nasal and oral airflow, thoracic and abdominal wall motion, anterior tibialis EMG, snore microphone, electrocardiogram, and pulse oximetry. Continuous positive airway pressure (CPAP) was initiated at the beginning of the study and titrated to treat sleep-disordered breathing.  MEDICATIONS Medications taken by the patient : charted for review Medications administered by patient during sleep study : No sleep medicine administered.  TECHNICIAN COMMENTS Comments added by technician: Patient talked in his/her sleep. Patient had difficulty initiating sleep.  Comments added by scorer: N/A  RESPIRATORY PARAMETERS Optimal PAP Pressure (cm): 10 AHI at Optimal Pressure (/hr): 10.0 Overall Minimal O2 (%): 81.00 Supine % at Optimal Pressure (%): 50 Minimal O2 at Optimal Pressure (%): 84.0    SLEEP ARCHITECTURE The study was initiated at 10:50:34 PM and ended at 4:53:28 AM. Sleep onset time was 47.0 minutes and the sleep efficiency was 76.8%. The total sleep time was 278.9 minutes. The patient spent 5.74% of the night in stage N1 sleep, 76.33% in stage N2 sleep, 0.90% in stage N3 and 17.03% in REM.Stage REM latency was 32.0 minutes Wake after sleep  onset was 37.0. Alpha intrusion was absent. Supine sleep was 64.50%.  CARDIAC DATA The 2 lead EKG demonstrated sinus rhythm. The mean heart rate was 56.27 beats per minute. Other EKG findings include: PVCs.  LEG MOVEMENT DATA The total Periodic Limb Movements of Sleep (PLMS) were 18. The PLMS index was 3.87. A PLMS index of <15 is considered normal in adults.  IMPRESSIONS - The optimal PAP pressure was 10 cm of water. - Mild Central Sleep Apnea was noted during this titration (CAI = 8.6/h). - Moderate oxygen desaturations were observed during this titration (min O2 = 81.00%). - No snoring was audible during this study. - 2-lead EKG demonstrated: PVCs - Clinically significant periodic limb movements were not noted during this study. Arousals associated with PLMs were rare.  DIAGNOSIS - Obstructive Sleep Apnea (327.23 [G47.33 ICD-10])  RECOMMENDATIONS - Trial of CPAP therapy on 10 cm H2O with a Medium size Fisher&Paykel Nasal Mask Eson mask and heated humidification. Control of events and patient comfort with initial exposure to CPAP were not optimal. Consider starting with CPAP autotitration 5-15 and adjusting as needed later.  - Avoid alcohol, sedatives and other CNS depressants that may worsen sleep apnea and disrupt normal sleep architecture. - Sleep hygiene should be reviewed to assess factors that may improve sleep quality. - Weight management and regular exercise should be initiated or continued.  Deneise Lever Diplomate, American Board of Sleep Medicine  ELECTRONICALLY SIGNED ON:  09/15/2015, 11:48 AM [place] PH: [phone]   FX: [phone] [place]

## 2016-04-20 DIAGNOSIS — M7541 Impingement syndrome of right shoulder: Secondary | ICD-10-CM | POA: Diagnosis not present

## 2016-05-25 DIAGNOSIS — M7541 Impingement syndrome of right shoulder: Secondary | ICD-10-CM | POA: Diagnosis not present

## 2016-06-03 DIAGNOSIS — M7541 Impingement syndrome of right shoulder: Secondary | ICD-10-CM | POA: Diagnosis not present

## 2016-06-08 DIAGNOSIS — M75101 Unspecified rotator cuff tear or rupture of right shoulder, not specified as traumatic: Secondary | ICD-10-CM | POA: Diagnosis not present

## 2016-06-08 DIAGNOSIS — H401131 Primary open-angle glaucoma, bilateral, mild stage: Secondary | ICD-10-CM | POA: Diagnosis not present

## 2016-06-08 DIAGNOSIS — M7541 Impingement syndrome of right shoulder: Secondary | ICD-10-CM | POA: Diagnosis not present

## 2016-10-13 DIAGNOSIS — N400 Enlarged prostate without lower urinary tract symptoms: Secondary | ICD-10-CM | POA: Diagnosis not present

## 2016-10-13 DIAGNOSIS — N50819 Testicular pain, unspecified: Secondary | ICD-10-CM | POA: Diagnosis not present

## 2016-10-13 DIAGNOSIS — R310 Gross hematuria: Secondary | ICD-10-CM | POA: Diagnosis not present

## 2016-10-26 DIAGNOSIS — H401131 Primary open-angle glaucoma, bilateral, mild stage: Secondary | ICD-10-CM | POA: Diagnosis not present

## 2016-10-28 ENCOUNTER — Encounter (INDEPENDENT_AMBULATORY_CARE_PROVIDER_SITE_OTHER): Payer: PPO | Admitting: Ophthalmology

## 2016-10-28 DIAGNOSIS — H33301 Unspecified retinal break, right eye: Secondary | ICD-10-CM | POA: Diagnosis not present

## 2016-10-28 DIAGNOSIS — H43813 Vitreous degeneration, bilateral: Secondary | ICD-10-CM | POA: Diagnosis not present

## 2016-10-28 DIAGNOSIS — R310 Gross hematuria: Secondary | ICD-10-CM | POA: Diagnosis not present

## 2016-11-12 ENCOUNTER — Ambulatory Visit (INDEPENDENT_AMBULATORY_CARE_PROVIDER_SITE_OTHER): Payer: PPO | Admitting: Ophthalmology

## 2016-11-13 ENCOUNTER — Ambulatory Visit (INDEPENDENT_AMBULATORY_CARE_PROVIDER_SITE_OTHER): Payer: PPO | Admitting: Ophthalmology

## 2016-11-13 DIAGNOSIS — H33301 Unspecified retinal break, right eye: Secondary | ICD-10-CM

## 2016-12-10 DIAGNOSIS — R310 Gross hematuria: Secondary | ICD-10-CM | POA: Diagnosis not present

## 2016-12-21 DIAGNOSIS — Z0000 Encounter for general adult medical examination without abnormal findings: Secondary | ICD-10-CM | POA: Diagnosis not present

## 2016-12-21 DIAGNOSIS — E7800 Pure hypercholesterolemia, unspecified: Secondary | ICD-10-CM | POA: Diagnosis not present

## 2016-12-21 DIAGNOSIS — R6882 Decreased libido: Secondary | ICD-10-CM | POA: Diagnosis not present

## 2016-12-21 DIAGNOSIS — Z125 Encounter for screening for malignant neoplasm of prostate: Secondary | ICD-10-CM | POA: Diagnosis not present

## 2016-12-21 DIAGNOSIS — Z1159 Encounter for screening for other viral diseases: Secondary | ICD-10-CM | POA: Diagnosis not present

## 2016-12-21 DIAGNOSIS — Z23 Encounter for immunization: Secondary | ICD-10-CM | POA: Diagnosis not present

## 2016-12-28 DIAGNOSIS — D1800 Hemangioma unspecified site: Secondary | ICD-10-CM | POA: Diagnosis not present

## 2016-12-28 DIAGNOSIS — R310 Gross hematuria: Secondary | ICD-10-CM | POA: Diagnosis not present

## 2016-12-30 ENCOUNTER — Ambulatory Visit
Admission: RE | Admit: 2016-12-30 | Discharge: 2016-12-30 | Disposition: A | Discharge status: Home / Self Care | Payer: PPO | Source: Ambulatory Visit | Attending: Family Medicine | Admitting: Family Medicine

## 2016-12-30 ENCOUNTER — Other Ambulatory Visit: Payer: Self-pay | Admitting: Family Medicine

## 2016-12-30 DIAGNOSIS — R0789 Other chest pain: Secondary | ICD-10-CM

## 2016-12-30 DIAGNOSIS — R079 Chest pain, unspecified: Secondary | ICD-10-CM | POA: Diagnosis not present

## 2016-12-30 DIAGNOSIS — E291 Testicular hypofunction: Secondary | ICD-10-CM | POA: Diagnosis not present

## 2017-01-01 DIAGNOSIS — R310 Gross hematuria: Secondary | ICD-10-CM | POA: Diagnosis not present

## 2017-01-01 DIAGNOSIS — N400 Enlarged prostate without lower urinary tract symptoms: Secondary | ICD-10-CM | POA: Diagnosis not present

## 2017-02-12 DIAGNOSIS — H401131 Primary open-angle glaucoma, bilateral, mild stage: Secondary | ICD-10-CM | POA: Diagnosis not present

## 2017-03-15 ENCOUNTER — Ambulatory Visit (INDEPENDENT_AMBULATORY_CARE_PROVIDER_SITE_OTHER): Payer: PPO | Admitting: Ophthalmology

## 2017-03-22 ENCOUNTER — Other Ambulatory Visit: Payer: Self-pay

## 2017-03-22 NOTE — Patient Outreach (Signed)
    This RNCM completed this HTA telephone screen. Patient denies nay case management needs at this time. Patient reports he is able to afford co payments, medication sand has transportation and has no issues related to food or shelter. This RNCM will send patient information regarding [facility] Programs for use if future needs arise.

## 2017-03-24 ENCOUNTER — Encounter (INDEPENDENT_AMBULATORY_CARE_PROVIDER_SITE_OTHER): Payer: PPO | Admitting: Ophthalmology

## 2017-03-24 DIAGNOSIS — H33301 Unspecified retinal break, right eye: Secondary | ICD-10-CM

## 2017-03-24 DIAGNOSIS — H43813 Vitreous degeneration, bilateral: Secondary | ICD-10-CM | POA: Diagnosis not present

## 2017-05-07 DIAGNOSIS — R310 Gross hematuria: Secondary | ICD-10-CM | POA: Diagnosis not present

## 2017-05-11 DIAGNOSIS — R350 Frequency of micturition: Secondary | ICD-10-CM | POA: Diagnosis not present

## 2017-05-11 DIAGNOSIS — R310 Gross hematuria: Secondary | ICD-10-CM | POA: Diagnosis not present

## 2017-06-30 DIAGNOSIS — H401131 Primary open-angle glaucoma, bilateral, mild stage: Secondary | ICD-10-CM | POA: Diagnosis not present

## 2017-07-05 DIAGNOSIS — N400 Enlarged prostate without lower urinary tract symptoms: Secondary | ICD-10-CM | POA: Diagnosis not present

## 2017-07-05 DIAGNOSIS — R310 Gross hematuria: Secondary | ICD-10-CM | POA: Diagnosis not present

## 2017-09-27 DIAGNOSIS — H401131 Primary open-angle glaucoma, bilateral, mild stage: Secondary | ICD-10-CM | POA: Diagnosis not present

## 2017-11-08 ENCOUNTER — Ambulatory Visit
Admission: RE | Admit: 2017-11-08 | Discharge: 2017-11-08 | Disposition: A | Discharge status: Home / Self Care | Payer: PPO | Source: Ambulatory Visit | Attending: Chiropractic Medicine | Admitting: Chiropractic Medicine

## 2017-11-08 ENCOUNTER — Other Ambulatory Visit: Payer: Self-pay | Admitting: Chiropractic Medicine

## 2017-11-08 DIAGNOSIS — M48061 Spinal stenosis, lumbar region without neurogenic claudication: Secondary | ICD-10-CM | POA: Diagnosis not present

## 2017-11-08 DIAGNOSIS — M1611 Unilateral primary osteoarthritis, right hip: Secondary | ICD-10-CM | POA: Diagnosis not present

## 2017-11-08 DIAGNOSIS — M25551 Pain in right hip: Secondary | ICD-10-CM

## 2017-11-19 DIAGNOSIS — H401131 Primary open-angle glaucoma, bilateral, mild stage: Secondary | ICD-10-CM | POA: Diagnosis not present

## 2018-01-10 DIAGNOSIS — L570 Actinic keratosis: Secondary | ICD-10-CM | POA: Diagnosis not present

## 2018-01-10 DIAGNOSIS — L218 Other seborrheic dermatitis: Secondary | ICD-10-CM | POA: Diagnosis not present

## 2018-01-10 DIAGNOSIS — D229 Melanocytic nevi, unspecified: Secondary | ICD-10-CM | POA: Diagnosis not present

## 2018-01-10 DIAGNOSIS — B351 Tinea unguium: Secondary | ICD-10-CM | POA: Diagnosis not present

## 2018-01-10 DIAGNOSIS — L821 Other seborrheic keratosis: Secondary | ICD-10-CM | POA: Diagnosis not present

## 2018-01-10 DIAGNOSIS — L814 Other melanin hyperpigmentation: Secondary | ICD-10-CM | POA: Diagnosis not present

## 2018-01-10 DIAGNOSIS — L738 Other specified follicular disorders: Secondary | ICD-10-CM | POA: Diagnosis not present

## 2018-01-26 DIAGNOSIS — Z1211 Encounter for screening for malignant neoplasm of colon: Secondary | ICD-10-CM | POA: Diagnosis not present

## 2018-01-26 DIAGNOSIS — Z125 Encounter for screening for malignant neoplasm of prostate: Secondary | ICD-10-CM | POA: Diagnosis not present

## 2018-01-26 DIAGNOSIS — I480 Paroxysmal atrial fibrillation: Secondary | ICD-10-CM | POA: Diagnosis not present

## 2018-01-26 DIAGNOSIS — N400 Enlarged prostate without lower urinary tract symptoms: Secondary | ICD-10-CM | POA: Diagnosis not present

## 2018-01-26 DIAGNOSIS — Z23 Encounter for immunization: Secondary | ICD-10-CM | POA: Diagnosis not present

## 2018-01-26 DIAGNOSIS — E7800 Pure hypercholesterolemia, unspecified: Secondary | ICD-10-CM | POA: Diagnosis not present

## 2018-01-26 DIAGNOSIS — E291 Testicular hypofunction: Secondary | ICD-10-CM | POA: Diagnosis not present

## 2018-01-26 DIAGNOSIS — Z0000 Encounter for general adult medical examination without abnormal findings: Secondary | ICD-10-CM | POA: Diagnosis not present

## 2018-01-26 DIAGNOSIS — M1990 Unspecified osteoarthritis, unspecified site: Secondary | ICD-10-CM | POA: Diagnosis not present

## 2018-01-28 DIAGNOSIS — M79602 Pain in left arm: Secondary | ICD-10-CM | POA: Diagnosis not present

## 2018-01-29 DIAGNOSIS — Z1211 Encounter for screening for malignant neoplasm of colon: Secondary | ICD-10-CM | POA: Diagnosis not present

## 2018-04-05 DIAGNOSIS — H401131 Primary open-angle glaucoma, bilateral, mild stage: Secondary | ICD-10-CM | POA: Diagnosis not present

## 2018-04-07 DIAGNOSIS — T63443A Toxic effect of venom of bees, assault, initial encounter: Secondary | ICD-10-CM | POA: Diagnosis not present

## 2018-06-22 DIAGNOSIS — R21 Rash and other nonspecific skin eruption: Secondary | ICD-10-CM | POA: Diagnosis not present

## 2018-06-22 DIAGNOSIS — W57XXXA Bitten or stung by nonvenomous insect and other nonvenomous arthropods, initial encounter: Secondary | ICD-10-CM | POA: Diagnosis not present

## 2018-06-22 DIAGNOSIS — G4733 Obstructive sleep apnea (adult) (pediatric): Secondary | ICD-10-CM | POA: Diagnosis not present

## 2018-07-07 DIAGNOSIS — H401131 Primary open-angle glaucoma, bilateral, mild stage: Secondary | ICD-10-CM | POA: Diagnosis not present

## 2018-07-12 DIAGNOSIS — L570 Actinic keratosis: Secondary | ICD-10-CM | POA: Diagnosis not present

## 2018-07-12 DIAGNOSIS — D1801 Hemangioma of skin and subcutaneous tissue: Secondary | ICD-10-CM | POA: Diagnosis not present

## 2018-07-12 DIAGNOSIS — B353 Tinea pedis: Secondary | ICD-10-CM | POA: Diagnosis not present

## 2018-07-12 DIAGNOSIS — L218 Other seborrheic dermatitis: Secondary | ICD-10-CM | POA: Diagnosis not present

## 2018-07-12 DIAGNOSIS — L821 Other seborrheic keratosis: Secondary | ICD-10-CM | POA: Diagnosis not present

## 2018-07-12 DIAGNOSIS — L814 Other melanin hyperpigmentation: Secondary | ICD-10-CM | POA: Diagnosis not present

## 2018-07-12 DIAGNOSIS — L309 Dermatitis, unspecified: Secondary | ICD-10-CM | POA: Diagnosis not present

## 2018-07-12 DIAGNOSIS — L819 Disorder of pigmentation, unspecified: Secondary | ICD-10-CM | POA: Diagnosis not present

## 2018-07-12 DIAGNOSIS — D229 Melanocytic nevi, unspecified: Secondary | ICD-10-CM | POA: Diagnosis not present

## 2018-07-18 DIAGNOSIS — G4733 Obstructive sleep apnea (adult) (pediatric): Secondary | ICD-10-CM | POA: Diagnosis not present

## 2018-07-18 DIAGNOSIS — J342 Deviated nasal septum: Secondary | ICD-10-CM | POA: Diagnosis not present

## 2018-08-05 DIAGNOSIS — H401131 Primary open-angle glaucoma, bilateral, mild stage: Secondary | ICD-10-CM | POA: Diagnosis not present

## 2018-08-10 DIAGNOSIS — R310 Gross hematuria: Secondary | ICD-10-CM | POA: Diagnosis not present

## 2018-08-10 DIAGNOSIS — R350 Frequency of micturition: Secondary | ICD-10-CM | POA: Diagnosis not present

## 2018-09-26 DIAGNOSIS — G4733 Obstructive sleep apnea (adult) (pediatric): Secondary | ICD-10-CM | POA: Diagnosis not present

## 2018-10-06 DIAGNOSIS — J019 Acute sinusitis, unspecified: Secondary | ICD-10-CM | POA: Diagnosis not present

## 2018-10-27 DIAGNOSIS — G4733 Obstructive sleep apnea (adult) (pediatric): Secondary | ICD-10-CM | POA: Diagnosis not present

## 2018-11-01 DIAGNOSIS — H401131 Primary open-angle glaucoma, bilateral, mild stage: Secondary | ICD-10-CM | POA: Diagnosis not present

## 2018-11-01 DIAGNOSIS — H5203 Hypermetropia, bilateral: Secondary | ICD-10-CM | POA: Diagnosis not present

## 2018-11-01 DIAGNOSIS — H524 Presbyopia: Secondary | ICD-10-CM | POA: Diagnosis not present

## 2018-11-01 DIAGNOSIS — H52223 Regular astigmatism, bilateral: Secondary | ICD-10-CM | POA: Diagnosis not present

## 2018-11-25 DIAGNOSIS — G4733 Obstructive sleep apnea (adult) (pediatric): Secondary | ICD-10-CM | POA: Diagnosis not present

## 2018-12-26 DIAGNOSIS — G4733 Obstructive sleep apnea (adult) (pediatric): Secondary | ICD-10-CM | POA: Diagnosis not present

## 2019-01-25 DIAGNOSIS — G4733 Obstructive sleep apnea (adult) (pediatric): Secondary | ICD-10-CM | POA: Diagnosis not present

## 2019-02-25 DIAGNOSIS — G4733 Obstructive sleep apnea (adult) (pediatric): Secondary | ICD-10-CM | POA: Diagnosis not present

## 2019-02-28 DIAGNOSIS — H43811 Vitreous degeneration, right eye: Secondary | ICD-10-CM | POA: Diagnosis not present

## 2019-03-20 ENCOUNTER — Other Ambulatory Visit: Payer: Self-pay

## 2019-03-20 ENCOUNTER — Encounter (INDEPENDENT_AMBULATORY_CARE_PROVIDER_SITE_OTHER): Payer: PPO | Admitting: Ophthalmology

## 2019-03-20 DIAGNOSIS — H33301 Unspecified retinal break, right eye: Secondary | ICD-10-CM | POA: Diagnosis not present

## 2019-03-20 DIAGNOSIS — H43311 Vitreous membranes and strands, right eye: Secondary | ICD-10-CM

## 2019-03-20 DIAGNOSIS — H43813 Vitreous degeneration, bilateral: Secondary | ICD-10-CM

## 2019-03-27 DIAGNOSIS — G4733 Obstructive sleep apnea (adult) (pediatric): Secondary | ICD-10-CM | POA: Diagnosis not present

## 2019-04-27 DIAGNOSIS — G4733 Obstructive sleep apnea (adult) (pediatric): Secondary | ICD-10-CM | POA: Diagnosis not present

## 2019-05-15 DIAGNOSIS — Z1211 Encounter for screening for malignant neoplasm of colon: Secondary | ICD-10-CM | POA: Diagnosis not present

## 2019-05-15 DIAGNOSIS — Z0000 Encounter for general adult medical examination without abnormal findings: Secondary | ICD-10-CM | POA: Diagnosis not present

## 2019-05-15 DIAGNOSIS — Z125 Encounter for screening for malignant neoplasm of prostate: Secondary | ICD-10-CM | POA: Diagnosis not present

## 2019-05-15 DIAGNOSIS — Z13220 Encounter for screening for lipoid disorders: Secondary | ICD-10-CM | POA: Diagnosis not present

## 2019-05-15 DIAGNOSIS — Z131 Encounter for screening for diabetes mellitus: Secondary | ICD-10-CM | POA: Diagnosis not present

## 2019-05-27 IMAGING — CR DG LUMBAR SPINE COMPLETE 4+V
5 series · 5 of 5 positions shown · non-contrast
Comparison: 12/28/2016 CT.

CLINICAL DATA: 66-year-old male with right posterior hip pain for 3
months which began in lower buttock. Now extending to the sacroiliac
region. Runs for exercise. Initial encounter.

EXAM:
LUMBAR SPINE - COMPLETE 4+ VIEW

[w lumbar spine ap]
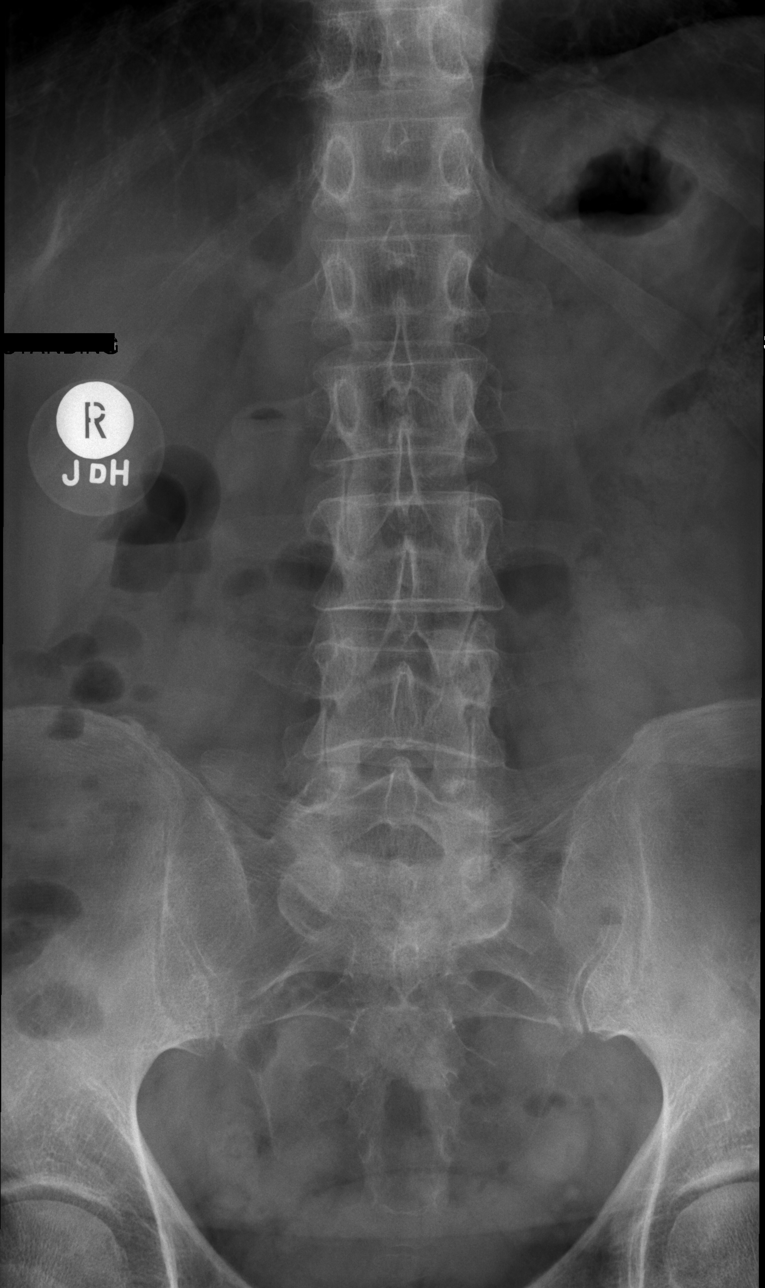

[w lumbar spine obl (1 of 2)]
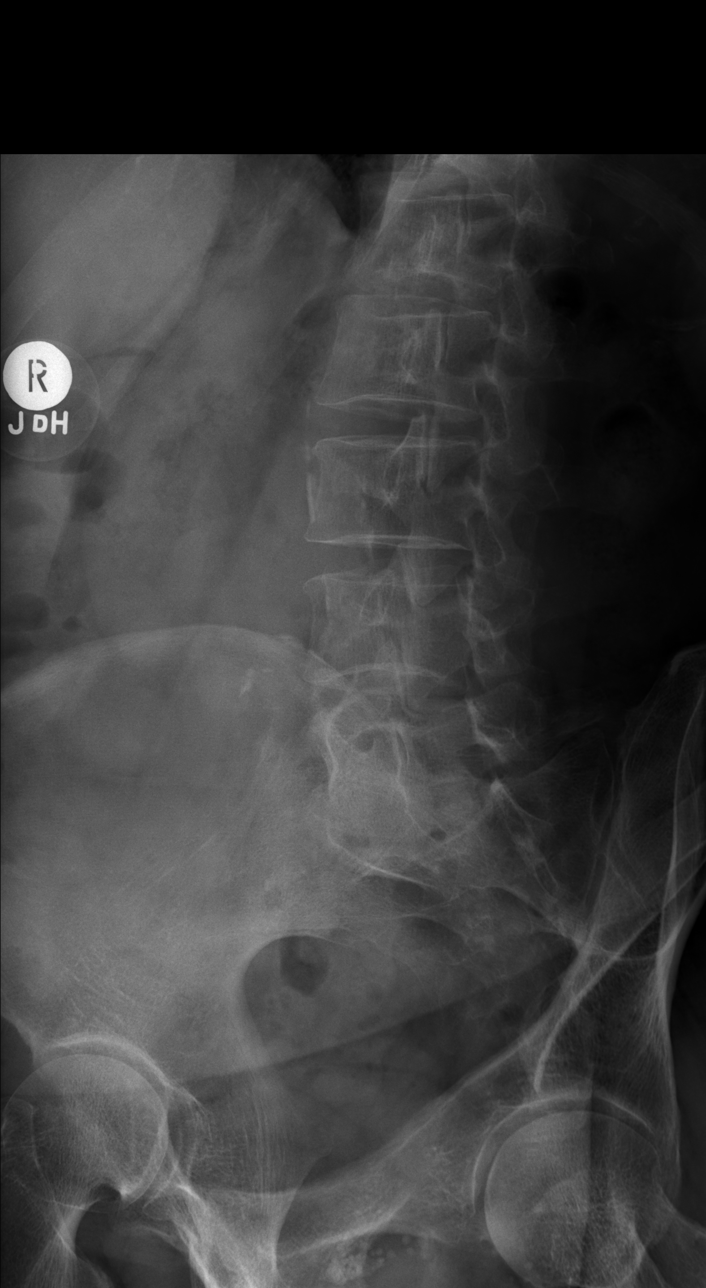

[w lumbar spine obl (2 of 2)]
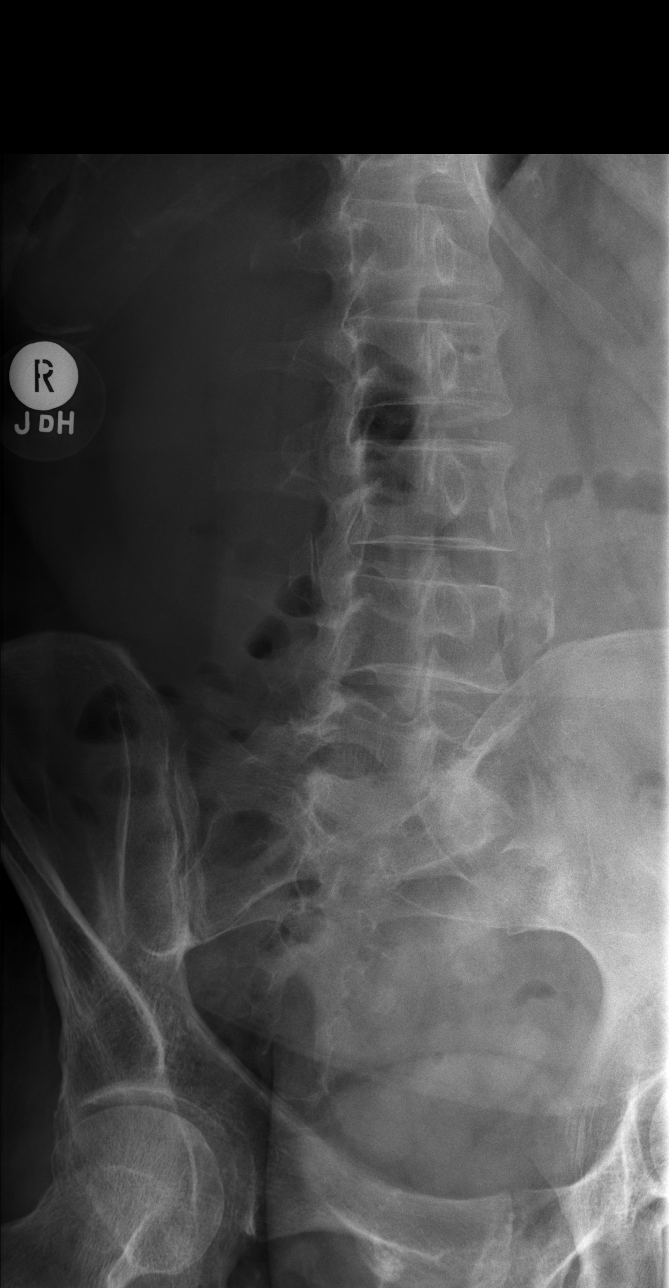

[w lumbar spine lat]
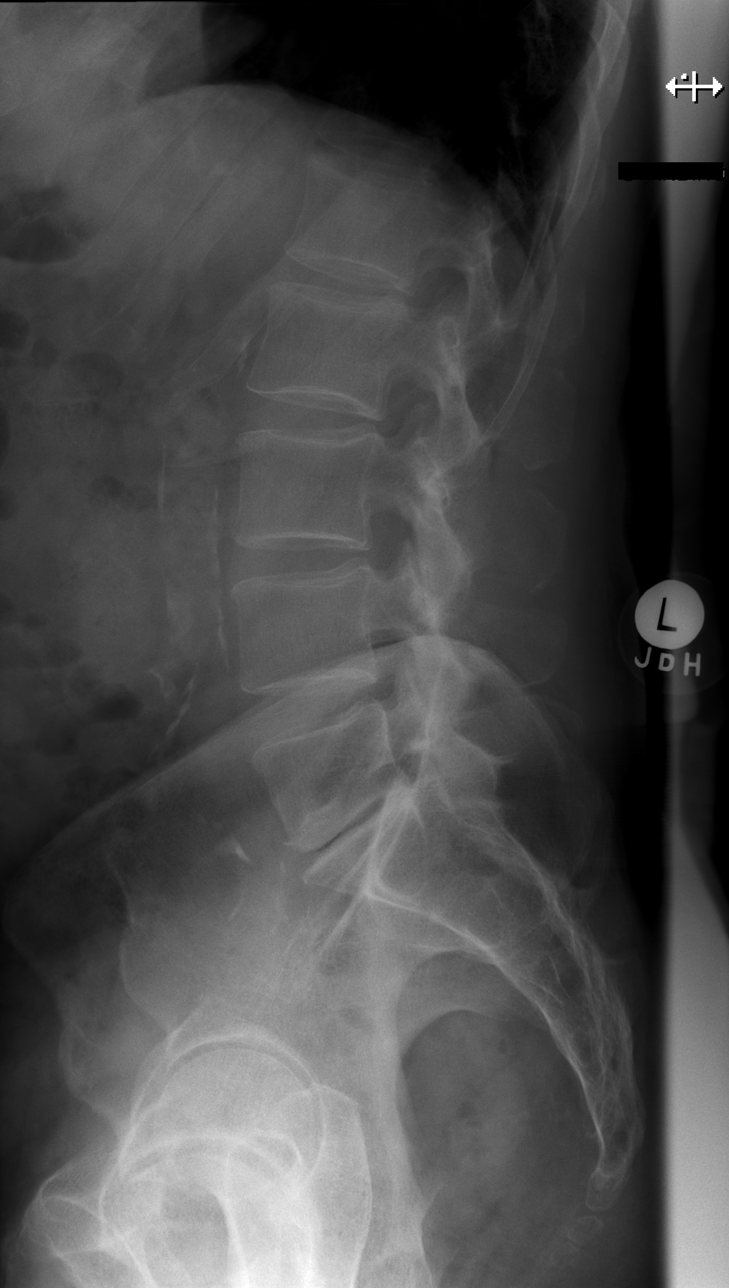

[w lumbar l-5 s-1 spot]
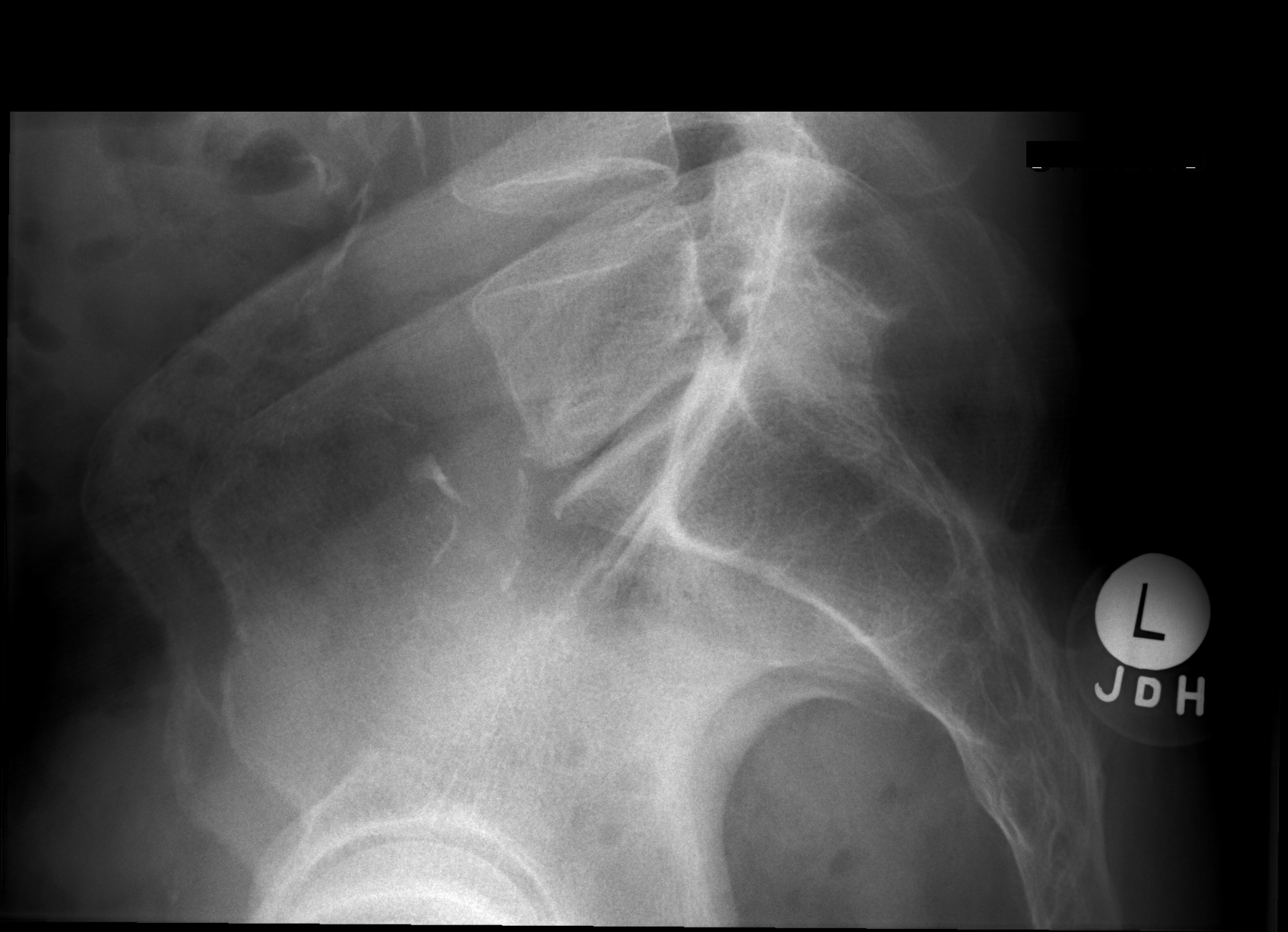

[5 of 5 positions shown; findings below may reference images not displayed]

FINDINGS: Minimal curvature lumbar spine convex left.

Moderate L5-S1 disc space narrowing.

No pars defect noted.

Sacroiliac joints appear intact.

Vascular calcifications.
IMPRESSION: Moderate L5-S1 disc space narrowing.

Aortic Atherosclerosis ([id number]).

## 2019-05-28 DIAGNOSIS — G4733 Obstructive sleep apnea (adult) (pediatric): Secondary | ICD-10-CM | POA: Diagnosis not present

## 2019-06-20 DIAGNOSIS — Z1211 Encounter for screening for malignant neoplasm of colon: Secondary | ICD-10-CM | POA: Diagnosis not present

## 2019-06-20 DIAGNOSIS — Z0000 Encounter for general adult medical examination without abnormal findings: Secondary | ICD-10-CM | POA: Diagnosis not present

## 2019-06-20 DIAGNOSIS — Z131 Encounter for screening for diabetes mellitus: Secondary | ICD-10-CM | POA: Diagnosis not present

## 2019-06-20 DIAGNOSIS — Z125 Encounter for screening for malignant neoplasm of prostate: Secondary | ICD-10-CM | POA: Diagnosis not present

## 2019-06-20 DIAGNOSIS — E291 Testicular hypofunction: Secondary | ICD-10-CM | POA: Diagnosis not present

## 2019-06-20 DIAGNOSIS — Z13220 Encounter for screening for lipoid disorders: Secondary | ICD-10-CM | POA: Diagnosis not present

## 2019-06-27 DIAGNOSIS — Z1211 Encounter for screening for malignant neoplasm of colon: Secondary | ICD-10-CM | POA: Diagnosis not present

## 2019-06-27 DIAGNOSIS — G4733 Obstructive sleep apnea (adult) (pediatric): Secondary | ICD-10-CM | POA: Diagnosis not present

## 2019-07-13 ENCOUNTER — Encounter: Payer: Self-pay | Admitting: Internal Medicine

## 2019-07-28 DIAGNOSIS — G4733 Obstructive sleep apnea (adult) (pediatric): Secondary | ICD-10-CM | POA: Diagnosis not present

## 2019-08-22 ENCOUNTER — Encounter: Payer: Self-pay | Admitting: *Deleted

## 2019-08-25 ENCOUNTER — Encounter: Payer: Self-pay | Admitting: Internal Medicine

## 2019-08-25 ENCOUNTER — Other Ambulatory Visit: Payer: Self-pay

## 2019-08-25 ENCOUNTER — Ambulatory Visit: Payer: PPO | Admitting: Internal Medicine

## 2019-08-25 VITALS — BP 142/84 | HR 94 | Temp 98.6°F | Ht 70.0 in | Wt 172.4 lb

## 2019-08-25 DIAGNOSIS — R195 Other fecal abnormalities: Secondary | ICD-10-CM | POA: Diagnosis not present

## 2019-08-25 DIAGNOSIS — Z1211 Encounter for screening for malignant neoplasm of colon: Secondary | ICD-10-CM

## 2019-08-25 NOTE — Patient Instructions (Addendum)
Your provider has ordered Cologuard testing as an option for colon cancer screening. This is performed by [employer] and may be out of network with your insurance. PRIOR to completing the test, it is YOUR responsibility to contact your insurance about covered benefits for this test. Your out of pocket expense could be anywhere from $0.00 to $649.00.   When you call to check coverage with your insurer, please provide the following information:   -The ONLY provider of Cologuard is [place] code for Cologuard is [phone].  [employer] NPI # [id number]  -[employer] Tax ID # [id number]   We have already sent your demographic and insurance information to [employer] [facility] (phone number [phone]) and they should contact you within the next week regarding your test. If you have not heard from them within the next week, please call our office at [phone]. ___________________________________________________  If you are age 68 or older, your body mass index should be between 23-30. Your Body mass index is 24.74 kg/m. If this is out of the aforementioned range listed, please consider follow up with your Primary Care Provider.  If you are age 25 or younger, your body mass index should be between 19-25. Your Body mass index is 24.74 kg/m. If this is out of the aformentioned range listed, please consider follow up with your Primary Care Provider.

## 2019-08-25 NOTE — Progress Notes (Signed)
Patient ID: George Gardner, male   DOB: [birth year], 68 y.o.   MRN: [id number] HPI: George Gardner is a 68 year old male with a past medical history of atrial fibrillation, BPH who is seen in consult at the request of Dr. Olen Pel to discuss heme positive stool.  He is here alone today.  He reports that he had a normal colonoscopy performed by Dr. Amedeo Plenty on 10/16/2011.  I do have a copy of this report.  This was a complete exam to the cecum with good prep.  The entire colon was reported as normal.  He then saw primary care for his annual physical and had FIT positive on 06/27/2019.  He reports that he thinks that this may be due to hemorrhoids though he seen no visible blood in his stool or melena.  No abdominal pain.  No change in bowel habit.  No upper GI or hepatobiliary complaint.  He would like to consider a repeat fecal occult blood test rather than proceeding to colonoscopy.  There is no family history of colon cancer.  Past Medical History:  Diagnosis Date  . Atrial fibrillation ([place])   . Attention deficit disorder   . BPH (benign prostatic hyperplasia)   . Carpal tunnel syndrome   . Dysrhythmia    never identified with halter monitor  . Epididymitis   . First detected episode of atrial fibrillation ([place])   . H/O eye surgery   . Hearing loss   . History of torn meniscus of left knee   . Hypercholesteremia   . Impingement syndrome, shoulder, right   . OSA (obstructive sleep apnea)    mild-didn't tolerate cpap  . Palpitations   . Sleep apnea   . Tinnitus   . Vitreous detachment of right eye     Past Surgical History:  Procedure Laterality Date  . CARPAL TUNNEL RELEASE  09/11/2011   Procedure: CARPAL TUNNEL RELEASE;  Surgeon: Tobi Bastos;  Location: [facility];  Service: Orthopedics;  Laterality: Right;  . COLONOSCOPY  10/16/2011  . EYE SURGERY  12/12   rt cataract removal w iol implant  . FOOT SURGERY  1971  . HERNIA REPAIR  2001  . TONSILLECTOMY  1961  .  yag laser surgery      Outpatient Medications Prior to Visit  Medication Sig Dispense Refill  . latanoprost (XALATAN) 0.005 % ophthalmic solution 1 drop at bedtime.    . naproxen (NAPROSYN) 250 MG tablet Take by mouth 2 (two) times daily with a meal. As needed    . ibuprofen (ADVIL,MOTRIN) 200 MG tablet Take 200 mg by mouth every 6 (six) hours as needed (pain).     No facility-administered medications prior to visit.    Allergies  Allergen Reactions  . Asa [Aspirin]     Severe ringing of the ears  . Codeine Other (See Comments)    disoriented  . Penicillins Hives    Family History  Problem Relation Age of Onset  . Arrhythmia Mother   . Thyroid disease Mother   . Other Mother        thyroidectomy  . Transient ischemic attack Mother   . Congestive Heart Failure Mother   . Other Father        sepsis  . Osteoarthritis Brother        knees  . Other Brother        back pain  . Other Sister        pacemaker,hip replacement  . Colon cancer  Paternal Uncle   . Heart disease Maternal Aunt        CHF, colon cancer    Social History   Tobacco Use  . Smoking status: Former Smoker    Quit date: 03/03/2008    Years since quitting: 11.4  . Smokeless tobacco: Never Used  Substance Use Topics  . Alcohol use: Yes    Alcohol/week: 1.0 standard drinks    Types: 1 Glasses of wine per week  . Drug use: No    ROS: As per history of present illness, otherwise negative  BP (!) 142/84 (BP Location: Left Arm, Patient Position: Sitting)   Pulse 94   Temp 98.6 F (37 C)   Ht 5\' 10"  (1.778 m)   Wt 172 lb 6.4 oz (78.2 kg)   SpO2 96%   BMI 24.74 kg/m  Gen: awake, alert, NAD HEENT: anicteric, op clear CV: RRR, no mrg Pulm: CTA b/l Abd: soft, NT/ND, +BS throughout Ext: no c/c/e Neuro: nonfocal   ASSESSMENT/PLAN: 68 year old male with a past medical history of atrial fibrillation, BPH who is seen in consult at the request of Dr. Olen Pel to discuss heme positive stool.  1. +  FIT --he did have a normal colonoscopy in 2013 though this was nearly 8 years ago.  We discussed the positive fecal immunohistochemical test together.  My recommendation would be for colonoscopy.  He would like to have another confirmatory test given that it has not been 10 years since his last colonoscopy.  We discussed how there is some risk in performing another test and not proceeding to colonoscopy, specifically missed lesions which may be present now.  He understands this risk.  We discussed repeating the immunohistochemical test versus Cologuard.  Cologuard being able to look for both blood and DNA changes associated with adenomatous polyps is more sensitive and specific.  Given that he prefers option other than colonoscopy, I have ordered Cologuard.  He understands that if Cologuard is positive we would proceed to colonoscopy. --Cologuard; if negative repeat in 3 years versus colonoscopy in 3 years    [id number], [address], [place],  [place] [id number]

## 2019-08-27 DIAGNOSIS — G4733 Obstructive sleep apnea (adult) (pediatric): Secondary | ICD-10-CM | POA: Diagnosis not present

## 2019-09-11 DIAGNOSIS — Z1211 Encounter for screening for malignant neoplasm of colon: Secondary | ICD-10-CM | POA: Diagnosis not present

## 2019-09-11 DIAGNOSIS — Z1212 Encounter for screening for malignant neoplasm of rectum: Secondary | ICD-10-CM | POA: Diagnosis not present

## 2019-09-18 ENCOUNTER — Other Ambulatory Visit: Payer: Self-pay

## 2019-09-18 LAB — COLOGUARD: Cologuard: NEGATIVE

## 2019-11-13 DIAGNOSIS — H401134 Primary open-angle glaucoma, bilateral, indeterminate stage: Secondary | ICD-10-CM | POA: Diagnosis not present

## 2020-03-29 DIAGNOSIS — H401134 Primary open-angle glaucoma, bilateral, indeterminate stage: Secondary | ICD-10-CM | POA: Diagnosis not present

## 2020-06-28 DIAGNOSIS — H401131 Primary open-angle glaucoma, bilateral, mild stage: Secondary | ICD-10-CM | POA: Diagnosis not present

## 2020-07-24 DIAGNOSIS — S62669B Nondisplaced fracture of distal phalanx of unspecified finger, initial encounter for open fracture: Secondary | ICD-10-CM | POA: Diagnosis not present

## 2020-07-29 DIAGNOSIS — S62635B Displaced fracture of distal phalanx of left ring finger, initial encounter for open fracture: Secondary | ICD-10-CM | POA: Diagnosis not present

## 2020-08-05 DIAGNOSIS — S62635D Displaced fracture of distal phalanx of left ring finger, subsequent encounter for fracture with routine healing: Secondary | ICD-10-CM | POA: Diagnosis not present

## 2020-08-28 DIAGNOSIS — S62635D Displaced fracture of distal phalanx of left ring finger, subsequent encounter for fracture with routine healing: Secondary | ICD-10-CM | POA: Diagnosis not present

## 2021-01-03 DIAGNOSIS — H40013 Open angle with borderline findings, low risk, bilateral: Secondary | ICD-10-CM | POA: Diagnosis not present

## 2021-01-15 DIAGNOSIS — N400 Enlarged prostate without lower urinary tract symptoms: Secondary | ICD-10-CM | POA: Diagnosis not present

## 2021-01-15 DIAGNOSIS — Z0000 Encounter for general adult medical examination without abnormal findings: Secondary | ICD-10-CM | POA: Diagnosis not present

## 2021-01-15 DIAGNOSIS — Z125 Encounter for screening for malignant neoplasm of prostate: Secondary | ICD-10-CM | POA: Diagnosis not present

## 2021-01-15 DIAGNOSIS — Z13220 Encounter for screening for lipoid disorders: Secondary | ICD-10-CM | POA: Diagnosis not present

## 2021-01-15 DIAGNOSIS — Z87448 Personal history of other diseases of urinary system: Secondary | ICD-10-CM | POA: Diagnosis not present

## 2021-01-15 DIAGNOSIS — E559 Vitamin D deficiency, unspecified: Secondary | ICD-10-CM | POA: Diagnosis not present

## 2021-01-30 DIAGNOSIS — E291 Testicular hypofunction: Secondary | ICD-10-CM | POA: Diagnosis not present

## 2021-02-11 DIAGNOSIS — R69 Illness, unspecified: Secondary | ICD-10-CM | POA: Diagnosis not present

## 2021-06-27 DIAGNOSIS — H401131 Primary open-angle glaucoma, bilateral, mild stage: Secondary | ICD-10-CM | POA: Diagnosis not present

## 2022-11-12 ENCOUNTER — Encounter: Payer: Self-pay | Admitting: Internal Medicine

## 2022-12-17 ENCOUNTER — Other Ambulatory Visit: Payer: Self-pay | Admitting: Orthopaedic Surgery

## 2022-12-17 DIAGNOSIS — Z01818 Encounter for other preprocedural examination: Secondary | ICD-10-CM

## 2023-01-14 ENCOUNTER — Ambulatory Visit
Admission: RE | Admit: 2023-01-14 | Discharge: 2023-01-14 | Disposition: A | Discharge status: Home / Self Care | Payer: PPO | Source: Ambulatory Visit | Attending: Orthopaedic Surgery | Admitting: Orthopaedic Surgery

## 2023-01-14 DIAGNOSIS — Z01818 Encounter for other preprocedural examination: Secondary | ICD-10-CM

## 2023-01-26 ENCOUNTER — Ambulatory Visit: Payer: PPO | Attending: Orthopaedic Surgery | Admitting: Physical Therapy

## 2023-01-26 ENCOUNTER — Encounter: Payer: Self-pay | Admitting: Physical Therapy

## 2023-01-26 DIAGNOSIS — M25511 Pain in right shoulder: Secondary | ICD-10-CM | POA: Insufficient documentation

## 2023-01-26 DIAGNOSIS — M6281 Muscle weakness (generalized): Secondary | ICD-10-CM | POA: Insufficient documentation

## 2023-01-26 DIAGNOSIS — G8929 Other chronic pain: Secondary | ICD-10-CM | POA: Diagnosis present

## 2023-01-26 NOTE — Therapy (Signed)
OUTPATIENT PHYSICAL THERAPY SHOULDER EVALUATION   Patient Name: George Gardner MRN: [id number] DOB:17-Mar-1951, 72 y.o., male Today's Date: 01/26/2023  END OF SESSION:  PT End of Session - 01/26/23 1608     Visit Number 1    Date for PT Re-Evaluation 04/28/23    Authorization Type HTA    PT Start Time 1608    PT Stop Time 1700    PT Time Calculation (min) 52 min    Activity Tolerance Patient tolerated treatment well    Behavior During Therapy WFL for tasks assessed/performed             Past Medical History:  Diagnosis Date   Atrial fibrillation ([facility])    Attention deficit disorder    BPH (benign prostatic hyperplasia)    Carpal tunnel syndrome    Dysrhythmia    never identified with halter monitor   Epididymitis    First detected episode of atrial fibrillation ([facility])    H/O eye surgery    Hearing loss    History of torn meniscus of left knee    Hypercholesteremia    Impingement syndrome, shoulder, right    OSA (obstructive sleep apnea)    mild-didn't tolerate cpap   Palpitations    Sleep apnea    Tinnitus    Vitreous detachment of right eye    Past Surgical History:  Procedure Laterality Date   CARPAL TUNNEL RELEASE  09/11/2011   Procedure: CARPAL TUNNEL RELEASE;  Surgeon: Jacki Cones;  Location: [facility];  Service: Orthopedics;  Laterality: Right;   COLONOSCOPY  10/16/2011   EYE SURGERY  12/12   rt cataract removal w iol implant   FOOT SURGERY  1971   HERNIA REPAIR  2001   TONSILLECTOMY  1961   yag laser surgery     Patient Active Problem List   Diagnosis Date Noted   Atrial fibrillation ([facility]) 06/14/2012   Syncope 06/14/2012   Right carpal tunnel syndrome 09/11/2011    PCP: Joycelyn Rua, MD  REFERRING PROVIDER: Ramond Marrow, MD  REFERRING DIAG: prehab Right Reverse Total Shoulder  THERAPY DIAG:  Chronic right shoulder pain  Muscle weakness (generalized)  Rationale for Evaluation and Treatment: Rehabilitation  ONSET  DATE: 10/15/22  SUBJECTIVE:                                                                                                                                                                                      SUBJECTIVE STATEMENT: Patient with right shoulder pain for about 8 years, he started having pain and spasms.  He popped a tendon on January 9th doing flies.  He is planned to have  a reverse total shoulder replacement.     Hand dominance: Right  PERTINENT HISTORY: See above  PAIN:  Are you having pain? Yes: NPRS scale: 2/10 Pain location: right anterior shoulder into the front of the upper arm and some posterior Pain description: ache, sharp and stabbing  Aggravating factors: movements of the arm 9-10/10 Relieving factors: takes antiinflammatory supplement and another supplement, rest helps  PRECAUTIONS: None  WEIGHT BEARING RESTRICTIONS: No  FALLS:  Has patient fallen in last 6 months? No  LIVING ENVIRONMENT: Lives with: lives with their family Lives in: House/apartment Stairs: No Has following equipment at home: None  OCCUPATION: retired  PLOF: Independent and cuts grass, some yardwork, goes to the gym 2x/week, runs 1/2 mile  PATIENT GOALS:be ready for surgery  NEXT MD VISIT:   OBJECTIVE:   DIAGNOSTIC FINDINGS:  Tear of the RC mms, bone spur  PATIENT SURVEYS:  FOTO 29  COGNITION: Overall cognitive status: Within functional limits for tasks assessed     SENSATION: WFL  POSTURE: Forward head, rounded shoulder  UPPER EXTREMITY ROM:   Active ROM Right Active eval Right  Passive eval  Shoulder flexion 110 P! 140  Shoulder extension    Shoulder abduction 60 P! 100  Shoulder adduction    Shoulder internal rotation 20 P! 20  Shoulder external rotation 60 P! 70  Elbow flexion    Elbow extension    Wrist flexion    Wrist extension    Wrist ulnar deviation    Wrist radial deviation    Wrist pronation    Wrist supination    (Blank rows = not  tested)  UPPER EXTREMITY MMT:  MMT Right eval Left eval  Shoulder flexion 3+ P!   Shoulder extension    Shoulder abduction 3 P!   Shoulder adduction    Shoulder internal rotation 3+ P!   Shoulder external rotation 4- P!   Middle trapezius    Lower trapezius    Elbow flexion    Elbow extension    Wrist flexion    Wrist extension    Wrist ulnar deviation    Wrist radial deviation    Wrist pronation    Wrist supination    Grip strength (lbs)    (Blank rows = not tested)  SHOULDER SPECIAL TESTS: Impingement tests: Neer impingement test: positive  and Painful arc test: positive  Rotator cuff assessment: Empty can test: positive   PALPATION:  Tender in the upper trap, biceps origin, rhomboid and teres   TODAY'S TREATMENT:                                                                                                                                         DATE:    PATIENT EDUCATION: Education details: POC/HEP Person educated: Patient Education method: Explanation, Demonstration, Tactile cues, Verbal cues, and Handouts Education comprehension: verbalized understanding, returned demonstration, verbal cues required, tactile cues  required, and needs further education  HOME EXERCISE PROGRAM: Access Code: [email or web address] URL: [email or web address] Date: 01/26/2023 Prepared by: Stacie Glaze  Exercises - Shoulder External Rotation and Scapular Retraction with Resistance  - 2 x daily - 7 x weekly - 2 sets - 10 reps - 3 hold - Standing Row with Anchored Resistance  - 2 x daily - 7 x weekly - 2 sets - 10 reps - 3 hold - Shoulder Extension with Resistance  - 2 x daily - 7 x weekly - 2 sets - 10 reps - 3 hold  ASSESSMENT:  CLINICAL IMPRESSION: Patient is a 72 y.o. male who was seen today for physical therapy evaluation and treatment for prehab for right reverse total shoulder replacement.  He has a lot of spasms in the right rhomboid, the upper trap and the biceps  area. He has severe limitations of shoulder ROM due to pain.  Surgery is scheduled for June 26th.  He really needs a lot of education on exercise, as he is doing way too many things, that are bad for like doing behind head lat pulls and then doing too heavy, when he popped the tendon he was doing 40# flies so I think the best thing we can do for him is getting him to understand that more is not better and form is what we need to look at  OBJECTIVE IMPAIRMENTS: decreased activity tolerance, decreased endurance, decreased ROM, decreased strength, increased muscle spasms, impaired flexibility, impaired UE functional use, improper body mechanics, postural dysfunction, and pain.   REHAB POTENTIAL: Good  CLINICAL DECISION MAKING: Stable/uncomplicated  EVALUATION COMPLEXITY: Low   GOALS: Goals reviewed with patient? Yes  SHORT TERM GOALS: Target date: 01/30/23  Independent with initial HEP Goal status: INITIAL  LONG TERM GOALS: Target date: 05/02/23  Understand posture and body mechanics Goal status: INITIAL  2.  Understand safe gym program Goal status: INITIAL  3.  Decrease pain 25% with ADL's Goal status: INITIAL  4.  Increase abduction to 110 degrees Goal status: INITIAL  PLAN:  PT FREQUENCY: 1-2x/week  PT DURATION: 8 weeks  PLANNED INTERVENTIONS: Therapeutic exercises, Therapeutic activity, Neuromuscular re-education, Balance training, Gait training, Patient/Family education, Self Care, Joint mobilization, Joint manipulation, Dry Needling, Electrical stimulation, Cryotherapy, Moist heat, Taping, Vasopneumatic device, Ultrasound, and Manual therapy  PLAN FOR NEXT SESSION: slow gentle scapular and RC stabilization, re-enforce form and safety   Tinsley Everman W, PT 01/26/2023, 4:11 PM

## 2023-02-03 ENCOUNTER — Ambulatory Visit: Payer: PPO

## 2023-02-03 ENCOUNTER — Other Ambulatory Visit: Payer: Self-pay

## 2023-02-03 DIAGNOSIS — M6281 Muscle weakness (generalized): Secondary | ICD-10-CM

## 2023-02-03 DIAGNOSIS — G8929 Other chronic pain: Secondary | ICD-10-CM

## 2023-02-03 DIAGNOSIS — M25511 Pain in right shoulder: Secondary | ICD-10-CM | POA: Diagnosis not present

## 2023-02-03 NOTE — Therapy (Signed)
OUTPATIENT PHYSICAL THERAPY SHOULDER TREATMENT   Patient Name: George Gardner MRN: [id number] DOB:Aug 26, 1951, 72 y.o., male Today's Date: 02/03/2023  END OF SESSION:  PT End of Session - 02/03/23 1350     Visit Number 2    Date for PT Re-Evaluation 04/28/23    Authorization Type HTA    PT Start Time 1345    PT Stop Time 1430    PT Time Calculation (min) 45 min    Activity Tolerance Patient tolerated treatment well    Behavior During Therapy WFL for tasks assessed/performed             Past Medical History:  Diagnosis Date   Atrial fibrillation (HCC)    Attention deficit disorder    BPH (benign prostatic hyperplasia)    Carpal tunnel syndrome    Dysrhythmia    never identified with halter monitor   Epididymitis    First detected episode of atrial fibrillation (HCC)    H/O eye surgery    Hearing loss    History of torn meniscus of left knee    Hypercholesteremia    Impingement syndrome, shoulder, right    OSA (obstructive sleep apnea)    mild-didn't tolerate cpap   Palpitations    Sleep apnea    Tinnitus    Vitreous detachment of right eye    Past Surgical History:  Procedure Laterality Date   CARPAL TUNNEL RELEASE  09/11/2011   Procedure: CARPAL TUNNEL RELEASE;  Surgeon: Jacki Cones;  Location: [facility];  Service: Orthopedics;  Laterality: Right;   COLONOSCOPY  10/16/2011   EYE SURGERY  12/12   rt cataract removal w iol implant   FOOT SURGERY  1971   HERNIA REPAIR  2001   TONSILLECTOMY  1961   yag laser surgery     Patient Active Problem List   Diagnosis Date Noted   Atrial fibrillation (HCC) 06/14/2012   Syncope 06/14/2012   Right carpal tunnel syndrome 09/11/2011    PCP: Joycelyn Rua, MD  REFERRING PROVIDER: Ramond Marrow, MD  REFERRING DIAG: prehab Right Reverse Total Shoulder  THERAPY DIAG:  No diagnosis found.  Rationale for Evaluation and Treatment: Rehabilitation  ONSET DATE: 10/15/22  SUBJECTIVE:                                                                                                                                                                                       SUBJECTIVE STATEMENT: Patient reports some aching with the exercises, not sure that he is performing them correctly. PERTINENT HISTORY: See above  PAIN:  Are you having pain? Yes: NPRS scale: 2/10 Pain location: right anterior shoulder into  the front of the upper arm and some posterior Pain description: ache, sharp and stabbing  Aggravating factors: movements of the arm 9-10/10 Relieving factors: takes antiinflammatory supplement and another supplement, rest helps  PRECAUTIONS: None  WEIGHT BEARING RESTRICTIONS: No  FALLS:  Has patient fallen in last 6 months? No  LIVING ENVIRONMENT: Lives with: lives with their family Lives in: House/apartment Stairs: No Has following equipment at home: None  OCCUPATION: retired  PLOF: Independent and cuts grass, some yardwork, goes to the gym 2x/week, runs 1/2 mile  PATIENT GOALS:be ready for surgery  NEXT MD VISIT:   OBJECTIVE:   DIAGNOSTIC FINDINGS:  Tear of the RC mms, bone spur  PATIENT SURVEYS:  FOTO 29  COGNITION: Overall cognitive status: Within functional limits for tasks assessed     SENSATION: WFL  POSTURE: Forward head, rounded shoulder  UPPER EXTREMITY ROM:   Active ROM Right Active eval Right  Passive eval  Shoulder flexion 110 P! 140  Shoulder extension    Shoulder abduction 60 P! 100  Shoulder adduction    Shoulder internal rotation 20 P! 20  Shoulder external rotation 60 P! 70  Elbow flexion    Elbow extension    Wrist flexion    Wrist extension    Wrist ulnar deviation    Wrist radial deviation    Wrist pronation    Wrist supination    (Blank rows = not tested)  UPPER EXTREMITY MMT:  MMT Right eval Left eval  Shoulder flexion 3+ P!   Shoulder extension    Shoulder abduction 3 P!   Shoulder adduction    Shoulder  internal rotation 3+ P!   Shoulder external rotation 4- P!   Middle trapezius    Lower trapezius    Elbow flexion    Elbow extension    Wrist flexion    Wrist extension    Wrist ulnar deviation    Wrist radial deviation    Wrist pronation    Wrist supination    Grip strength (lbs)    (Blank rows = not tested)  SHOULDER SPECIAL TESTS: Impingement tests: Neer impingement test: positive  and Painful arc test: positive  Rotator cuff assessment: Empty can test: positive   PALPATION:  Tender in the upper trap, biceps origin, rhomboid and teres   TODAY'S TREATMENT:                                                                                                                                         DATE: 02/03/23: Therapeutic exercise: Initially reviewed and had patient demo the exercises from instruction at initial evaluation: Needed correction for hand position for B shoulder ER , was pronating and replicating impingement R shoulder.  Progressed with additional ex: Serratus punches, tried in standing with t band but painful, so added in supine, with 1 # wt,  Biceps curls, in a protected position, R elbow resting  on thigh, curls with forearm supinated Triceps extension: patient demonstrated bent over triceps extension and performed correctly , advised to continue as needed  Instructed in pendulum R shoulder , with 1 to 2 # wt for pain relief, jt spacing as needed.  Provided with printed directions of all of the above.     PATIENT EDUCATION: Education details: POC/HEP Person educated: Patient Education method: Programmer, multimedia, Demonstration, Actor cues, Verbal cues, and Handouts Education comprehension: verbalized understanding, returned demonstration, verbal cues required, tactile cues required, and needs further education  HOME EXERCISE PROGRAM: Access Code: [email or web address] URL: [email or web address] Date: 01/26/2023 Prepared by: Stacie Glaze  Exercises - Shoulder  External Rotation and Scapular Retraction with Resistance  - 2 x daily - 7 x weekly - 2 sets - 10 reps - 3 hold - Standing Row with Anchored Resistance  - 2 x daily - 7 x weekly - 2 sets - 10 reps - 3 hold - Shoulder Extension with Resistance  - 2 x daily - 7 x weekly - 2 sets - 10 reps - 3 hold  ASSESSMENT:  CLINICAL IMPRESSION: Patient is a 72 y.o. male who was seen today for physical therapy evaluation and treatment for prehab for right reverse total shoulder replacement.  Has been performing the ex,  needed some re direction on his initial program, added more for scapular stabilization.  Did attempt deltoid strengthening but too much pain.overall he tolerated all activities well.  At end of session he reported R upper traps pain and rhomboid pain and mentioned that he would like dry needling or some other manual technique for assisting with pain relief.  Will address next visit at beginning of visit.    OBJECTIVE IMPAIRMENTS: decreased activity tolerance, decreased endurance, decreased ROM, decreased strength, increased muscle spasms, impaired flexibility, impaired UE functional use, improper body mechanics, postural dysfunction, and pain.   REHAB POTENTIAL: Good  CLINICAL DECISION MAKING: Stable/uncomplicated  EVALUATION COMPLEXITY: Low   GOALS: Goals reviewed with patient? Yes  SHORT TERM GOALS: Target date: 01/30/23  Independent with initial HEP Goal status: INITIAL  LONG TERM GOALS: Target date: 05/02/23  Understand posture and body mechanics Goal status: INITIAL  2.  Understand safe gym program Goal status: INITIAL  3.  Decrease pain 25% with ADL's Goal status: INITIAL  4.  Increase abduction to 110 degrees Goal status: INITIAL  PLAN:  PT FREQUENCY: 1-2x/week  PT DURATION: 8 weeks  PLANNED INTERVENTIONS: Therapeutic exercises, Therapeutic activity, Neuromuscular re-education, Balance training, Gait training, Patient/Family education, Self Care, Joint mobilization,  Joint manipulation, Dry Needling, Electrical stimulation, Cryotherapy, Moist heat, Taping, Vasopneumatic device, Ultrasound, and Manual therapy  PLAN FOR NEXT SESSION: slow gentle scapular and RC stabilization, re-enforce form and safety, ask about pain and consider pain relief techniques such as dry needling, modalities, manual techniques   Reynol Arnone L Trish Mancinelli, PT 02/03/2023, 5:17 PM

## 2023-02-10 ENCOUNTER — Ambulatory Visit: Payer: PPO

## 2023-02-10 ENCOUNTER — Encounter (HOSPITAL_COMMUNITY): Payer: Self-pay

## 2023-02-12 ENCOUNTER — Ambulatory Visit: Payer: PPO | Admitting: Physical Therapy

## 2023-02-16 ENCOUNTER — Ambulatory Visit: Payer: PPO | Attending: Orthopaedic Surgery | Admitting: Physical Therapy

## 2023-02-16 ENCOUNTER — Encounter: Payer: Self-pay | Admitting: Physical Therapy

## 2023-02-16 DIAGNOSIS — M6281 Muscle weakness (generalized): Secondary | ICD-10-CM | POA: Insufficient documentation

## 2023-02-16 DIAGNOSIS — M25511 Pain in right shoulder: Secondary | ICD-10-CM | POA: Insufficient documentation

## 2023-02-16 DIAGNOSIS — G8929 Other chronic pain: Secondary | ICD-10-CM | POA: Diagnosis present

## 2023-02-16 NOTE — Therapy (Signed)
OUTPATIENT PHYSICAL THERAPY SHOULDER TREATMENT   Patient Name: George Gardner MRN: [id number] DOB:1951-05-12, 72 y.o., male Today's Date: 02/16/2023  END OF SESSION:  PT End of Session - 02/16/23 1401     Visit Number 3    Date for PT Re-Evaluation 04/28/23    Authorization Type HTA    PT Start Time 1400    PT Stop Time 1442    PT Time Calculation (min) 42 min    Activity Tolerance Patient tolerated treatment well    Behavior During Therapy WFL for tasks assessed/performed             Past Medical History:  Diagnosis Date   Atrial fibrillation (HCC)    Attention deficit disorder    BPH (benign prostatic hyperplasia)    Carpal tunnel syndrome    Dysrhythmia    never identified with halter monitor   Epididymitis    First detected episode of atrial fibrillation (HCC)    H/O eye surgery    Hearing loss    History of torn meniscus of left knee    Hypercholesteremia    Impingement syndrome, shoulder, right    OSA (obstructive sleep apnea)    mild-didn't tolerate cpap   Palpitations    Sleep apnea    Tinnitus    Vitreous detachment of right eye    Past Surgical History:  Procedure Laterality Date   CARPAL TUNNEL RELEASE  09/11/2011   Procedure: CARPAL TUNNEL RELEASE;  Surgeon: Jacki Cones;  Location: [facility];  Service: Orthopedics;  Laterality: Right;   COLONOSCOPY  10/16/2011   EYE SURGERY  12/12   rt cataract removal w iol implant   FOOT SURGERY  1971   HERNIA REPAIR  2001   TONSILLECTOMY  1961   yag laser surgery     Patient Active Problem List   Diagnosis Date Noted   Atrial fibrillation (HCC) 06/14/2012   Syncope 06/14/2012   Right carpal tunnel syndrome 09/11/2011    PCP: Joycelyn Rua, MD  REFERRING PROVIDER: Ramond Marrow, MD  REFERRING DIAG: prehab Right Reverse Total Shoulder  THERAPY DIAG:  Chronic right shoulder pain  Muscle weakness (generalized)  Rationale for Evaluation and Treatment: Rehabilitation  ONSET  DATE: 10/15/22  SUBJECTIVE:                                                                                                                                                                                      SUBJECTIVE STATEMENT: Patient reports went to the beach last week and finally got to do his exercises and thinks he is doing a little better. PERTINENT HISTORY: See above  PAIN:  Are  you having pain? Yes: NPRS scale: 2/10 Pain location: right anterior shoulder into the front of the upper arm and some posterior Pain description: ache, sharp and stabbing  Aggravating factors: movements of the arm 9-10/10 Relieving factors: takes antiinflammatory supplement and another supplement, rest helps  PRECAUTIONS: None  WEIGHT BEARING RESTRICTIONS: No  FALLS:  Has patient fallen in last 6 months? No  LIVING ENVIRONMENT: Lives with: lives with their family Lives in: House/apartment Stairs: No Has following equipment at home: None  OCCUPATION: retired  PLOF: Independent and cuts grass, some yardwork, goes to the gym 2x/week, runs 1/2 mile  PATIENT GOALS:be ready for surgery  NEXT MD VISIT:   OBJECTIVE:   DIAGNOSTIC FINDINGS:  Tear of the RC mms, bone spur  PATIENT SURVEYS:  FOTO 29  COGNITION: Overall cognitive status: Within functional limits for tasks assessed     SENSATION: WFL  POSTURE: Forward head, rounded shoulder  UPPER EXTREMITY ROM:   Active ROM Right Active eval Right  Passive eval  Shoulder flexion 110 P! 140  Shoulder extension    Shoulder abduction 60 P! 100  Shoulder adduction    Shoulder internal rotation 20 P! 20  Shoulder external rotation 60 P! 70  Elbow flexion    Elbow extension    Wrist flexion    Wrist extension    Wrist ulnar deviation    Wrist radial deviation    Wrist pronation    Wrist supination    (Blank rows = not tested)  UPPER EXTREMITY MMT:  MMT Right eval Left eval  Shoulder flexion 3+ P!   Shoulder extension     Shoulder abduction 3 P!   Shoulder adduction    Shoulder internal rotation 3+ P!   Shoulder external rotation 4- P!   Middle trapezius    Lower trapezius    Elbow flexion    Elbow extension    Wrist flexion    Wrist extension    Wrist ulnar deviation    Wrist radial deviation    Wrist pronation    Wrist supination    Grip strength (lbs)    (Blank rows = not tested)  SHOULDER SPECIAL TESTS: Impingement tests: Neer impingement test: positive  and Painful arc test: positive  Rotator cuff assessment: Empty can test: positive   PALPATION:  Tender in the upper trap, biceps origin, rhomboid and teres   TODAY'S TREATMENT:                                                                                                                                         DATE: 02/16/23 Reviewed HEP, patient with a lot of questions.  Worked on posture and form and talked about safety DN to the upper trap, right rhomboids STM to the above Wall slides, flexion, scaption, circles 2# biceps and triceps Green tband extension and row Yellow tband ER and IR 2# serratus All right  shoulder isometrics   02/03/23: Therapeutic exercise: Initially reviewed and had patient demo the exercises from instruction at initial evaluation: Needed correction for hand position for B shoulder ER , was pronating and replicating impingement R shoulder.  Progressed with additional ex: Serratus punches, tried in standing with t band but painful, so added in supine, with 1 # wt,  Biceps curls, in a protected position, R elbow resting on thigh, curls with forearm supinated Triceps extension: patient demonstrated bent over triceps extension and performed correctly , advised to continue as needed  Instructed in pendulum R shoulder , with 1 to 2 # wt for pain relief, jt spacing as needed.  Provided with printed directions of all of the above.     PATIENT EDUCATION: Education details: POC/HEP Person educated: Patient Education  method: Programmer, multimedia, Demonstration, Actor cues, Verbal cues, and Handouts Education comprehension: verbalized understanding, returned demonstration, verbal cues required, tactile cues required, and needs further education  HOME EXERCISE PROGRAM: Access Code: [email or web address] URL: [email or web address] Date: 01/26/2023 Prepared by: Stacie Glaze  Exercises - Shoulder External Rotation and Scapular Retraction with Resistance  - 2 x daily - 7 x weekly - 2 sets - 10 reps - 3 hold - Standing Row with Anchored Resistance  - 2 x daily - 7 x weekly - 2 sets - 10 reps - 3 hold - Shoulder Extension with Resistance  - 2 x daily - 7 x weekly - 2 sets - 10 reps - 3 hold  ASSESSMENT:  CLINICAL IMPRESSION: Patient is a 72 y.o. male who was seen today for physical therapy evaluation and treatment for prehab for right reverse total shoulder replacement.  Has been performing the ex,  needed some re direction on his initial program, added more for scapular stabilization.  Did attempt deltoid strengthening but too much pain.overall he tolerated all activities well.  At end of session he reported R upper traps pain and rhomboid pain and mentioned that he would like dry needling or some other manual technique for assisting with pain relief.  Will address next visit at beginning of visit.    OBJECTIVE IMPAIRMENTS: decreased activity tolerance, decreased endurance, decreased ROM, decreased strength, increased muscle spasms, impaired flexibility, impaired UE functional use, improper body mechanics, postural dysfunction, and pain.   REHAB POTENTIAL: Good  CLINICAL DECISION MAKING: Stable/uncomplicated  EVALUATION COMPLEXITY: Low   GOALS: Goals reviewed with patient? Yes  SHORT TERM GOALS: Target date: 01/30/23  Independent with initial HEP Goal status: met 02/16/23  LONG TERM GOALS: Target date: 05/02/23  Understand posture and body mechanics Goal status: ongoing 02/16/23  2.  Understand safe gym  program Goal status: progressing 02/16/23  3.  Decrease pain 25% with ADL's Goal status: INITIAL  4.  Increase abduction to 110 degrees Goal status: INITIAL  PLAN:  PT FREQUENCY: 1-2x/week  PT DURATION: 8 weeks  PLANNED INTERVENTIONS: Therapeutic exercises, Therapeutic activity, Neuromuscular re-education, Balance training, Gait training, Patient/Family education, Self Care, Joint mobilization, Joint manipulation, Dry Needling, Electrical stimulation, Cryotherapy, Moist heat, Taping, Vasopneumatic device, Ultrasound, and Manual therapy  PLAN FOR NEXT SESSION: slow gentle scapular and RC stabilization, re-enforce form and safety, ask about pain and consider pain relief techniques such as dry needling, modalities, manual techniques   Amzie Sillas W, PT 02/16/2023, 2:02 PM

## 2023-02-18 NOTE — Progress Notes (Signed)
Surgery orders requested via Epic inbox. °

## 2023-02-22 NOTE — Progress Notes (Signed)
COVID Vaccine received:  [x]  No []  Yes Date of any COVID positive Test in last 90 days:No  PCP - Betsey Holiday Cardiologist - No  Chest x-ray - 12/30/16 EPIC EKG -  02/25/23 EPIC Stress Test - 05/17/12 EPIC ECHO - No Cardiac Cath - No  Medical Clearance  Betsey Holiday PA-C  12/21/22   Bowel Prep - [x]  No  []   Yes ______  Pacemaker / ICD device [x]  No []  Yes   Spinal Cord Stimulator:[x]  No []  Yes       History of Sleep Apnea? []  No [x]  Yes   CPAP used?- [x]  No []  Yes    Does the patient monitor blood sugar?          [x]  No []  Yes  []  N/A  Patient has: [x]  NO Hx DM   []  Pre-DM                 []  DM1  []   DM2 Does patient have a [employer] or Dexacom? [x]  No []  Yes   Fasting Blood Sugar Ranges-  Checks Blood Sugar _____ times a day  GLP1 agonist / usual dose - No GLP1 instructions:  SGLT-2 inhibitors / usual dose - No SGLT-2 instructions:   Blood Thinner / Instructions:No Aspirin Instructions:  Comments:   Activity level: Patient is able to climb a flight of stairs without difficulty; [x]  No CP  [x]  No SOB,   Patient can / perform ADLs without assistance.   Anesthesia review: A-fib, OSA-does not use CPAP, Abnl EKG at PST visit, No Cardiologist  Patient denies shortness of breath, fever, cough and chest pain at PAT appointment.  Patient verbalized understanding and agreement to the Pre-Surgical Instructions that were given to them at this PAT appointment. Patient was also educated of the need to review these PAT instructions again prior to his/her surgery.I reviewed the appropriate phone numbers to call if they have any and questions or concerns.

## 2023-02-22 NOTE — Patient Instructions (Signed)
SURGICAL WAITING ROOM VISITATION  Patients having surgery or a procedure may have no more than 2 support people in the waiting area - these visitors may rotate.    Children under the age of 96 must have an adult with them who is not the patient.  Due to an increase in RSV and influenza rates and associated hospitalizations, children ages 62 and under may not visit patients in [facility] hospitals.  If the patient needs to stay at the hospital during part of their recovery, the visitor guidelines for inpatient rooms apply. Pre-op nurse will coordinate an appropriate time for 1 support person to accompany patient in pre-op.  This support person may not rotate.    Please refer to the [facility] website for the visitor guidelines for Inpatients (after your surgery is over and you are in a regular room).       Your procedure is scheduled on: 03/10/2023   Report to [facility] Main Entrance    Report to admitting at 5:15 AM   Call this number if you have problems the morning of surgery [phone]   Do not eat food :After Midnight.         Oral Hygiene is also important to reduce your risk of infection.                                    Remember - BRUSH YOUR TEETH THE MORNING OF SURGERY WITH YOUR REGULAR TOOTHPASTE  DENTURES WILL BE REMOVED PRIOR TO SURGERY PLEASE DO NOT APPLY "Poly grip" OR ADHESIVES!!!   Do NOT smoke after Midnight   Take these medicines the morning of surgery with A SIP OF WATER: none  Bring CPAP mask and tubing day of surgery.                              You may not have any metal on your body including hair pins, jewelry, and body piercing             Do not wear make-up, lotions, powders, cologne, or deodorant  .               Men may shave face and neck.   Do not bring valuables to the hospital. [facility] IS NOT             RESPONSIBLE   FOR VALUABLES.   Contacts, glasses, dentures or bridgework may not be worn into surgery.  DO NOT  BRING YOUR HOME MEDICATIONS TO THE HOSPITAL. PHARMACY WILL DISPENSE MEDICATIONS LISTED ON YOUR MEDICATION LIST TO YOU DURING YOUR ADMISSION IN THE HOSPITAL!    Patients discharged on the day of surgery will not be allowed to drive home.  Someone NEEDS to stay with you for the first 24 hours after anesthesia.   Special Instructions: Bring a copy of your healthcare power of attorney and living will documents the day of surgery if you haven't scanned them before.              Please read over the following fact sheets you were given: IF YOU HAVE QUESTIONS ABOUT YOUR PRE-OP INSTRUCTIONS PLEASE CALL [phone]   If you received a COVID test during your pre-op visit  it is requested that you wear a mask when out in public, stay away from anyone that may not be feeling well and notify  your surgeon if you develop symptoms. If you test positive for Covid or have been in contact with anyone that has tested positive in the last 10 days please notify you surgeon.      Pre-operative 5 CHG Bath Instructions   You can play a key role in reducing the risk of infection after surgery. Your skin needs to be as free of germs as possible. You can reduce the number of germs on your skin by washing with CHG (chlorhexidine gluconate) soap before surgery. CHG is an antiseptic soap that kills germs and continues to kill germs even after washing.   DO NOT use if you have an allergy to chlorhexidine/CHG or antibacterial soaps. If your skin becomes reddened or irritated, stop using the CHG and notify one of our RNs at [phone].   Please shower with the CHG soap starting 4 days before surgery using the following schedule:     Please keep in mind the following:  DO NOT shave, including legs and underarms, starting the day of your first shower.   You may shave your face at any point before/day of surgery.  Place clean sheets on your bed the day you start using CHG soap. Use a clean washcloth (not used since being  washed) for each shower. DO NOT sleep with pets once you start using the CHG.   CHG Shower Instructions:  If you choose to wash your hair and private area, wash first with your normal shampoo/soap.  After you use shampoo/soap, rinse your hair and body thoroughly to remove shampoo/soap residue.  Turn the water OFF and apply about 3 tablespoons (45 ml) of CHG soap to a CLEAN washcloth.  Apply CHG soap ONLY FROM YOUR NECK DOWN TO YOUR TOES (washing for 3-5 minutes)  DO NOT use CHG soap on face, private areas, open wounds, or sores.  Pay special attention to the area where your surgery is being performed.  If you are having back surgery, having someone wash your back for you may be helpful. Wait 2 minutes after CHG soap is applied, then you may rinse off the CHG soap.  Pat dry with a clean towel  Put on clean clothes/pajamas   If you choose to wear lotion, please use ONLY the CHG-compatible lotions on the back of this paper.     Additional instructions for the day of surgery: DO NOT APPLY any lotions, deodorants, cologne, or perfumes.   Put on clean/comfortable clothes.  Brush your teeth.  Ask your nurse before applying any prescription medications to the skin.      CHG Compatible Lotions   Aveeno Moisturizing lotion  Cetaphil Moisturizing Cream  Cetaphil Moisturizing Lotion  Clairol Herbal Essence Moisturizing Lotion, Dry Skin  Clairol Herbal Essence Moisturizing Lotion, Extra Dry Skin  Clairol Herbal Essence Moisturizing Lotion, Normal Skin  Curel Age Defying Therapeutic Moisturizing Lotion with Alpha Hydroxy  Curel Extreme Care Body Lotion  Curel Soothing Hands Moisturizing Hand Lotion  Curel Therapeutic Moisturizing Cream, Fragrance-Free  Curel Therapeutic Moisturizing Lotion, Fragrance-Free  Curel Therapeutic Moisturizing Lotion, Original Formula  Eucerin Daily Replenishing Lotion  Eucerin Dry Skin Therapy Plus Alpha Hydroxy Crme  Eucerin Dry Skin Therapy Plus Alpha  Hydroxy Lotion  Eucerin Original Crme  Eucerin Original Lotion  Eucerin Plus Crme Eucerin Plus Lotion  Eucerin TriLipid Replenishing Lotion  Keri Anti-Bacterial Hand Lotion  Keri Deep Conditioning Original Lotion Dry Skin Formula Softly Scented  Keri Deep Conditioning Original Lotion, Fragrance Free Sensitive Skin Formula  Keri Lotion Fast  Absorbing Fragrance Free Sensitive Skin Formula  Keri Lotion Fast Absorbing Softly Scented Dry Skin Formula  Keri Original Lotion  Keri Skin Renewal Lotion Keri Silky Smooth Lotion  Keri Silky Smooth Sensitive Skin Lotion  Nivea Body Creamy Conditioning Oil  Nivea Body Extra Enriched Teacher, adult education Moisturizing Lotion Nivea Crme  Nivea Skin Firming Lotion  NutraDerm 30 Skin Lotion  NutraDerm Skin Lotion  NutraDerm Therapeutic Skin Cream  NutraDerm Therapeutic Skin Lotion  ProShield Protective Hand Cream  Provon moisturizing lotion [facility]- Preparing for Total Shoulder Arthroplasty    Before surgery, you can play an important role. Because skin is not sterile, your skin needs to be as free of germs as possible. You can reduce the number of germs on your skin by using the following products. Benzoyl Peroxide Gel Reduces the number of germs present on the skin Applied twice a day to shoulder area starting two days before surgery    ==================================================================  Please follow these instructions carefully:  BENZOYL PEROXIDE 5% GEL  Please do not use if you have an allergy to benzoyl peroxide.   If your skin becomes reddened/irritated stop using the benzoyl peroxide.  Starting two days before surgery, apply as follows: Apply benzoyl peroxide in the morning and at night. Apply after taking a shower. If you are not taking a shower clean entire shoulder front, back, and side along with the armpit with a clean wet washcloth.  Place a quarter-sized dollop on your  shoulder and rub in thoroughly, making sure to cover the front, back, and side of your shoulder, along with the armpit.   2 days before ____ AM   ____ PM              1 day before ____ AM   ____ PM                         Do this twice a day for two days.  (Last application is the night before surgery, AFTER using the CHG soap as described below). Rest and breat Do NOT apply benzoyl peroxide gel on the day of surgery. Marland Kitchen

## 2023-02-23 ENCOUNTER — Encounter: Payer: Self-pay | Admitting: Physical Therapy

## 2023-02-23 ENCOUNTER — Ambulatory Visit: Payer: PPO | Admitting: Physical Therapy

## 2023-02-23 DIAGNOSIS — G8929 Other chronic pain: Secondary | ICD-10-CM

## 2023-02-23 DIAGNOSIS — M6281 Muscle weakness (generalized): Secondary | ICD-10-CM

## 2023-02-23 DIAGNOSIS — M25511 Pain in right shoulder: Secondary | ICD-10-CM | POA: Diagnosis not present

## 2023-02-23 NOTE — Therapy (Signed)
OUTPATIENT PHYSICAL THERAPY SHOULDER TREATMENT   Patient Name: George Gardner MRN: [id number] DOB:1951/06/27, 72 y.o., male Today's Date: 02/23/2023  END OF SESSION:  PT End of Session - 02/23/23 1408     Visit Number 4    Date for PT Re-Evaluation 04/28/23    Authorization Type HTA    PT Start Time 1400    PT Stop Time 1444    PT Time Calculation (min) 44 min    Activity Tolerance Patient tolerated treatment well    Behavior During Therapy WFL for tasks assessed/performed             Past Medical History:  Diagnosis Date   Atrial fibrillation (HCC)    Attention deficit disorder    BPH (benign prostatic hyperplasia)    Carpal tunnel syndrome    Dysrhythmia    never identified with halter monitor   Epididymitis    First detected episode of atrial fibrillation (HCC)    H/O eye surgery    Hearing loss    History of torn meniscus of left knee    Hypercholesteremia    Impingement syndrome, shoulder, right    OSA (obstructive sleep apnea)    mild-didn't tolerate cpap   Palpitations    Sleep apnea    Tinnitus    Vitreous detachment of right eye    Past Surgical History:  Procedure Laterality Date   CARPAL TUNNEL RELEASE  09/11/2011   Procedure: CARPAL TUNNEL RELEASE;  Surgeon: Jacki Cones;  Location: [facility];  Service: Orthopedics;  Laterality: Right;   COLONOSCOPY  10/16/2011   EYE SURGERY  12/12   rt cataract removal w iol implant   FOOT SURGERY  1971   HERNIA REPAIR  2001   TONSILLECTOMY  1961   yag laser surgery     Patient Active Problem List   Diagnosis Date Noted   Atrial fibrillation (HCC) 06/14/2012   Syncope 06/14/2012   Right carpal tunnel syndrome 09/11/2011    PCP: Joycelyn Rua, MD  REFERRING PROVIDER: Ramond Marrow, MD  REFERRING DIAG: prehab Right Reverse Total Shoulder  THERAPY DIAG:  Chronic right shoulder pain  Muscle weakness (generalized)  Rationale for Evaluation and Treatment: Rehabilitation  ONSET  DATE: 10/15/22  SUBJECTIVE:                                                                                                                                                                                      SUBJECTIVE STATEMENT: Patient reports that he has been very busy, has done 2 yards of mulch , so the arm and shoulder is very sore and tired. PERTINENT HISTORY: See above  PAIN:  Are you having pain? Yes: NPRS scale: 4/10 Pain location: right anterior shoulder into the front of the upper arm and some posterior Pain description: ache, sharp and stabbing  Aggravating factors: movements of the arm 9-10/10 Relieving factors: takes antiinflammatory supplement and another supplement, rest helps  PRECAUTIONS: None  WEIGHT BEARING RESTRICTIONS: No  FALLS:  Has patient fallen in last 6 months? No  LIVING ENVIRONMENT: Lives with: lives with their family Lives in: House/apartment Stairs: No Has following equipment at home: None  OCCUPATION: retired  PLOF: Independent and cuts grass, some yardwork, goes to the gym 2x/week, runs 1/2 mile  PATIENT GOALS:be ready for surgery  NEXT MD VISIT:   OBJECTIVE:   DIAGNOSTIC FINDINGS:  Tear of the RC mms, bone spur  PATIENT SURVEYS:  FOTO 29  COGNITION: Overall cognitive status: Within functional limits for tasks assessed     SENSATION: WFL  POSTURE: Forward head, rounded shoulder  UPPER EXTREMITY ROM:   Active ROM Right Active eval Right  Passive eval  Shoulder flexion 110 P! 140  Shoulder extension    Shoulder abduction 60 P! 100  Shoulder adduction    Shoulder internal rotation 20 P! 20  Shoulder external rotation 60 P! 70  Elbow flexion    Elbow extension    Wrist flexion    Wrist extension    Wrist ulnar deviation    Wrist radial deviation    Wrist pronation    Wrist supination    (Blank rows = not tested)  UPPER EXTREMITY MMT:  MMT Right eval Left eval  Shoulder flexion 3+ P!   Shoulder extension     Shoulder abduction 3 P!   Shoulder adduction    Shoulder internal rotation 3+ P!   Shoulder external rotation 4- P!   Middle trapezius    Lower trapezius    Elbow flexion    Elbow extension    Wrist flexion    Wrist extension    Wrist ulnar deviation    Wrist radial deviation    Wrist pronation    Wrist supination    Grip strength (lbs)    (Blank rows = not tested)  SHOULDER SPECIAL TESTS: Impingement tests: Neer impingement test: positive  and Painful arc test: positive  Rotator cuff assessment: Empty can test: positive   PALPATION:  Tender in the upper trap, biceps origin, rhomboid and teres   TODAY'S TREATMENT:                                                                                                                                         DATE: 02/23/23 UBE level 3 x 6 minutes 25# rows 25# lats Red tband ER Red tband horizontal abduction very painful Red tband IR Serratus push 5# DN to the upper trap, deltoid and rhomboid STM to the above  02/16/23 Reviewed HEP, patient with a lot of questions.  Worked on posture  and form and talked about safety DN to the upper trap, right rhomboids STM to the above Wall slides, flexion, scaption, circles 2# biceps and triceps Green tband extension and row Yellow tband ER and IR 2# serratus All right shoulder isometrics   02/03/23: Therapeutic exercise: Initially reviewed and had patient demo the exercises from instruction at initial evaluation: Needed correction for hand position for B shoulder ER , was pronating and replicating impingement R shoulder.  Progressed with additional ex: Serratus punches, tried in standing with t band but painful, so added in supine, with 1 # wt,  Biceps curls, in a protected position, R elbow resting on thigh, curls with forearm supinated Triceps extension: patient demonstrated bent over triceps extension and performed correctly , advised to continue as needed  Instructed in pendulum R  shoulder , with 1 to 2 # wt for pain relief, jt spacing as needed.  Provided with printed directions of all of the above.     PATIENT EDUCATION: Education details: POC/HEP Person educated: Patient Education method: Programmer, multimedia, Demonstration, Actor cues, Verbal cues, and Handouts Education comprehension: verbalized understanding, returned demonstration, verbal cues required, tactile cues required, and needs further education  HOME EXERCISE PROGRAM: Access Code: [email or web address] URL: [email or web address] Date: 01/26/2023 Prepared by: Stacie Glaze  Exercises - Shoulder External Rotation and Scapular Retraction with Resistance  - 2 x daily - 7 x weekly - 2 sets - 10 reps - 3 hold - Standing Row with Anchored Resistance  - 2 x daily - 7 x weekly - 2 sets - 10 reps - 3 hold - Shoulder Extension with Resistance  - 2 x daily - 7 x weekly - 2 sets - 10 reps - 3 hold  ASSESSMENT:  CLINICAL IMPRESSION: Patient is a 72 y.o. male who was seen today for physical therapy evaluation and treatment for prehab for right reverse total shoulder replacement. Patient has been very active with mulch in the yard, he is very sore.  Had spasms and tenderness with LTR's for upper trap, deltoid and rhomboid.  Trying to get him to understand some of the protocol for after surgery  OBJECTIVE IMPAIRMENTS: decreased activity tolerance, decreased endurance, decreased ROM, decreased strength, increased muscle spasms, impaired flexibility, impaired UE functional use, improper body mechanics, postural dysfunction, and pain.   REHAB POTENTIAL: Good  CLINICAL DECISION MAKING: Stable/uncomplicated  EVALUATION COMPLEXITY: Low   GOALS: Goals reviewed with patient? Yes  SHORT TERM GOALS: Target date: 01/30/23  Independent with initial HEP Goal status: met 02/16/23  LONG TERM GOALS: Target date: 05/02/23  Understand posture and body mechanics Goal status: ongoing 02/16/23  2.  Understand safe gym  program Goal status: progressing 02/16/23  3.  Decrease pain 25% with ADL's Goal status: ongoing 02/23/23  4.  Increase abduction to 110 degrees Goal status: ongoing 02/23/23  PLAN:  PT FREQUENCY: 1-2x/week  PT DURATION: 8 weeks  PLANNED INTERVENTIONS: Therapeutic exercises, Therapeutic activity, Neuromuscular re-education, Balance training, Gait training, Patient/Family education, Self Care, Joint mobilization, Joint manipulation, Dry Needling, Electrical stimulation, Cryotherapy, Moist heat, Taping, Vasopneumatic device, Ultrasound, and Manual therapy  PLAN FOR NEXT SESSION: Go over the protocol for his recovery   Jearld Lesch, PT 02/23/2023, 2:09 PM

## 2023-02-25 ENCOUNTER — Encounter (HOSPITAL_COMMUNITY): Payer: Self-pay

## 2023-02-25 ENCOUNTER — Encounter (HOSPITAL_COMMUNITY)
Admission: RE | Admit: 2023-02-25 | Discharge: 2023-02-25 | Disposition: A | Discharge status: Home / Self Care | Payer: PPO | Source: Ambulatory Visit | Attending: Orthopaedic Surgery | Admitting: Orthopaedic Surgery

## 2023-02-25 ENCOUNTER — Other Ambulatory Visit: Payer: Self-pay

## 2023-02-25 VITALS — BP 142/78 | HR 79 | Temp 97.5°F | Resp 16 | Ht 70.0 in | Wt 171.0 lb

## 2023-02-25 DIAGNOSIS — Z01818 Encounter for other preprocedural examination: Secondary | ICD-10-CM | POA: Insufficient documentation

## 2023-02-25 DIAGNOSIS — R001 Bradycardia, unspecified: Secondary | ICD-10-CM | POA: Insufficient documentation

## 2023-02-25 HISTORY — DX: Other complications of anesthesia, initial encounter: T88.59XA

## 2023-02-25 HISTORY — DX: Pneumonia, unspecified organism: J18.9

## 2023-02-25 LAB — BASIC METABOLIC PANEL
Anion gap: 7 (ref 5–15)
BUN: 15 mg/dL (ref 8–23)
CO2: 25 mmol/L (ref 22–32)
Calcium: 8.9 mg/dL (ref 8.9–10.3)
Chloride: 104 mmol/L (ref 98–111)
Creatinine, Ser: 0.95 mg/dL (ref 0.61–1.24)
GFR, Estimated: >60 mL/min (ref >60)
Glucose, Bld: 86 mg/dL (ref 70–99)
Potassium: 3.9 mmol/L (ref 3.5–5.1)
Sodium: 136 mmol/L (ref 135–145)

## 2023-02-25 LAB — SURGICAL PCR SCREEN
MRSA, PCR: NEGATIVE
Staphylococcus aureus: NEGATIVE

## 2023-02-25 LAB — CBC
HCT: 46.4 % (ref 39.0–52.0)
Hemoglobin: 15.6 g/dL (ref 13.0–17.0)
MCH: 33.4 pg (ref 26.0–34.0)
MCHC: 33.6 g/dL (ref 30.0–36.0)
MCV: 99.4 fL (ref 80.0–100.0)
Platelets: 275 10*3/uL (ref 150–400)
RBC: 4.67 MIL/uL (ref 4.22–5.81)
RDW: 13.2 % (ref 11.5–15.5)
WBC: 6.9 10*3/uL (ref 4.0–10.5)
nRBC: 0 % (ref 0.0–0.2)

## 2023-03-01 ENCOUNTER — Encounter (HOSPITAL_COMMUNITY): Payer: Self-pay | Admitting: Physician Assistant

## 2023-03-01 ENCOUNTER — Encounter (HOSPITAL_COMMUNITY): Payer: Self-pay | Admitting: Anesthesiology

## 2023-03-02 ENCOUNTER — Encounter (HOSPITAL_COMMUNITY): Payer: Self-pay

## 2023-03-02 ENCOUNTER — Encounter: Payer: Self-pay | Admitting: Physical Therapy

## 2023-03-02 ENCOUNTER — Ambulatory Visit: Payer: PPO | Admitting: Physical Therapy

## 2023-03-02 DIAGNOSIS — M25511 Pain in right shoulder: Secondary | ICD-10-CM | POA: Diagnosis not present

## 2023-03-02 DIAGNOSIS — G8929 Other chronic pain: Secondary | ICD-10-CM

## 2023-03-02 DIAGNOSIS — M6281 Muscle weakness (generalized): Secondary | ICD-10-CM

## 2023-03-02 NOTE — Progress Notes (Signed)
Case: [id number] Date/Time: 03/10/23 0715   Procedure: REVERSE SHOULDER ARTHROPLASTY (Right: Shoulder)   Anesthesia type: General   Pre-op diagnosis: OA RIGHT SHOULDER   Location: [facility] ROOM 06 / [facility]   Surgeons: Bjorn Pippin, MD       DISCUSSION: George Gardner is a 72 yo male who presents to PAT prior to surgery listed above. PMH significant for former smoking, OSA (does not tolerate CPAP), palpitations, BPH.  Prior complications from anesthesia includes "shaking uncontrollably, very anxious, pain". Unclear from which anesthesia event/surgery this was from.  Patient saw Cardiology remotely for palpitations (in 2016). It was thought to be due to A.fib however there is not any good documentation for this so it has been managed expectantly. He is not on blood thinners. He denies CP/SOB, lightheadedness/syncope at PAT visit and has good functional status. Has been participating in prehab for his upcoming shoulder. On screening EKG at PAT visit it demonstrates a new bifasicular block. Cardiac clearance requested.   VS: BP (!) 142/78   Pulse 79   Temp (!) 36.4 C (Oral)   Resp 16   Ht 5\' 10"  (1.778 m)   Wt 77.6 kg   SpO2 98%   BMI 24.54 kg/m   PROVIDERS: Rick Duff, PA-C   LABS: Labs reviewed: Acceptable for surgery. (all labs ordered are listed, but only abnormal results are displayed)  Labs Reviewed  SURGICAL PCR SCREEN  BASIC METABOLIC PANEL  CBC     IMAGES:   EKG:   CV:  Past Medical History:  Diagnosis Date   Atrial fibrillation (HCC)    Attention deficit disorder    BPH (benign prostatic hyperplasia)    Carpal tunnel syndrome    Complication of anesthesia    shaking uncontrollably, very anxious, pain   Dysrhythmia    never identified with halter monitor   Epididymitis    H/O eye surgery    Hearing loss    History of torn meniscus of left knee    Hypercholesteremia    Impingement syndrome, shoulder, right    OSA (obstructive sleep apnea)     mild-didn't tolerate cpap   Palpitations    Pneumonia    Sleep apnea    Tinnitus    Vitreous detachment of right eye     Past Surgical History:  Procedure Laterality Date   CARPAL TUNNEL RELEASE  09/11/2011   Procedure: CARPAL TUNNEL RELEASE;  Surgeon: Jacki Cones;  Location: [facility];  Service: Orthopedics;  Laterality: Right;   COLONOSCOPY  10/16/2011   EYE SURGERY  08/15/2011   rt cataract removal w iol implant   FOOT SURGERY  09/14/1969   HERNIA REPAIR Left 1998   again in 2000   TONSILLECTOMY  09/15/1959   yag laser surgery      MEDICATIONS:  L-ARGININE PO   latanoprost (XALATAN) 0.005 % ophthalmic solution   MAGNESIUM PO   OVER THE COUNTER MEDICATION   OVER THE COUNTER MEDICATION   sodium chloride (OCEAN) 0.65 % SOLN nasal spray   Testosterone 20.25 MG/ACT (1.62%) GEL   Zinc Acetate, Oral, (ZINC ACETATE PO)   No current facility-administered medications for this encounter.   Marcille Blanco [facility] Short Stay/Anesthesiology [facility] Phone [phone] 03/05/2023 10:39 AM

## 2023-03-02 NOTE — Therapy (Signed)
OUTPATIENT PHYSICAL THERAPY SHOULDER TREATMENT   Patient Name: George Gardner MRN: [id number] DOB:Apr 28, 1951, 72 y.o., male Today's Date: 03/02/2023  END OF SESSION:  PT End of Session - 03/02/23 1441     Visit Number 5    Date for PT Re-Evaluation 04/28/23    Authorization Type HTA    PT Start Time 1400    PT Stop Time 1443    PT Time Calculation (min) 43 min    Activity Tolerance Patient tolerated treatment well    Behavior During Therapy WFL for tasks assessed/performed             Past Medical History:  Diagnosis Date   Atrial fibrillation (HCC)    Attention deficit disorder    BPH (benign prostatic hyperplasia)    Carpal tunnel syndrome    Complication of anesthesia    shaking uncontrollably, very anxious, pain   Dysrhythmia    never identified with halter monitor   Epididymitis    H/O eye surgery    Hearing loss    History of torn meniscus of left knee    Hypercholesteremia    Impingement syndrome, shoulder, right    OSA (obstructive sleep apnea)    mild-didn't tolerate cpap   Palpitations    Pneumonia    Sleep apnea    Tinnitus    Vitreous detachment of right eye    Past Surgical History:  Procedure Laterality Date   CARPAL TUNNEL RELEASE  09/11/2011   Procedure: CARPAL TUNNEL RELEASE;  Surgeon: Jacki Cones;  Location: [facility];  Service: Orthopedics;  Laterality: Right;   COLONOSCOPY  10/16/2011   EYE SURGERY  08/15/2011   rt cataract removal w iol implant   FOOT SURGERY  09/14/1969   HERNIA REPAIR Left 1998   again in 2000   TONSILLECTOMY  09/15/1959   yag laser surgery     Patient Active Problem List   Diagnosis Date Noted   Atrial fibrillation (HCC) 06/14/2012   Syncope 06/14/2012   Right carpal tunnel syndrome 09/11/2011    PCP: Joycelyn Rua, MD  REFERRING PROVIDER: Ramond Marrow, MD  REFERRING DIAG: prehab Right Reverse Total Shoulder  THERAPY DIAG:  Chronic right shoulder pain  Muscle weakness  (generalized)  Rationale for Evaluation and Treatment: Rehabilitation  ONSET DATE: 10/15/22  SUBJECTIVE:                                                                                                                                                                                      SUBJECTIVE STATEMENT: Patient has spread a lot of mulch over the weekend and reports very sore and was very sore after  PT last week PERTINENT HISTORY: See above  PAIN:  Are you having pain? Yes: NPRS scale: 4/10 Pain location: right anterior shoulder into the front of the upper arm and some posterior Pain description: ache, sharp and stabbing  Aggravating factors: movements of the arm 9-10/10 Relieving factors: takes antiinflammatory supplement and another supplement, rest helps  PRECAUTIONS: None  WEIGHT BEARING RESTRICTIONS: No  FALLS:  Has patient fallen in last 6 months? No  LIVING ENVIRONMENT: Lives with: lives with their family Lives in: House/apartment Stairs: No Has following equipment at home: None  OCCUPATION: retired  PLOF: Independent and cuts grass, some yardwork, goes to the gym 2x/week, runs 1/2 mile  PATIENT GOALS:be ready for surgery  NEXT MD VISIT:   OBJECTIVE:   DIAGNOSTIC FINDINGS:  Tear of the RC mms, bone spur  PATIENT SURVEYS:  FOTO 29  COGNITION: Overall cognitive status: Within functional limits for tasks assessed     SENSATION: WFL  POSTURE: Forward head, rounded shoulder  UPPER EXTREMITY ROM:   Active ROM Right Active eval Right  Passive eval  Shoulder flexion 110 P! 140  Shoulder extension    Shoulder abduction 60 P! 100  Shoulder adduction    Shoulder internal rotation 20 P! 20  Shoulder external rotation 60 P! 70  Elbow flexion    Elbow extension    Wrist flexion    Wrist extension    Wrist ulnar deviation    Wrist radial deviation    Wrist pronation    Wrist supination    (Blank rows = not tested)  UPPER EXTREMITY MMT:  MMT  Right eval Left eval  Shoulder flexion 3+ P!   Shoulder extension    Shoulder abduction 3 P!   Shoulder adduction    Shoulder internal rotation 3+ P!   Shoulder external rotation 4- P!   Middle trapezius    Lower trapezius    Elbow flexion    Elbow extension    Wrist flexion    Wrist extension    Wrist ulnar deviation    Wrist radial deviation    Wrist pronation    Wrist supination    Grip strength (lbs)    (Blank rows = not tested)  SHOULDER SPECIAL TESTS: Impingement tests: Neer impingement test: positive  and Painful arc test: positive  Rotator cuff assessment: Empty can test: positive   PALPATION:  Tender in the upper trap, biceps origin, rhomboid and teres   TODAY'S TREATMENT:                                                                                                                                         DATE: 03/02/23 WEnt over and educated in the protocol for after surgery went over the  ROM limitations and what to expect STM to the right upper trap, the right deltoid the rhomboid and the teres area with some gentle stretches  02/23/23  UBE level 3 x 6 minutes 25# rows 25# lats Red tband ER Red tband horizontal abduction very painful Red tband IR Serratus push 5# DN to the upper trap, deltoid and rhomboid STM to the above  02/16/23 Reviewed HEP, patient with a lot of questions.  Worked on posture and form and talked about safety DN to the upper trap, right rhomboids STM to the above Wall slides, flexion, scaption, circles 2# biceps and triceps Green tband extension and row Yellow tband ER and IR 2# serratus All right shoulder isometrics   02/03/23: Therapeutic exercise: Initially reviewed and had patient demo the exercises from instruction at initial evaluation: Needed correction for hand position for B shoulder ER , was pronating and replicating impingement R shoulder.  Progressed with additional ex: Serratus punches, tried in standing with t band  but painful, so added in supine, with 1 # wt,  Biceps curls, in a protected position, R elbow resting on thigh, curls with forearm supinated Triceps extension: patient demonstrated bent over triceps extension and performed correctly , advised to continue as needed  Instructed in pendulum R shoulder , with 1 to 2 # wt for pain relief, jt spacing as needed.  Provided with printed directions of all of the above.     PATIENT EDUCATION: Education details: POC/HEP Person educated: Patient Education method: Programmer, multimedia, Demonstration, Actor cues, Verbal cues, and Handouts Education comprehension: verbalized understanding, returned demonstration, verbal cues required, tactile cues required, and needs further education  HOME EXERCISE PROGRAM: Access Code: [email or web address] URL: [email or web address] Date: 01/26/2023 Prepared by: Stacie Glaze  Exercises - Shoulder External Rotation and Scapular Retraction with Resistance  - 2 x daily - 7 x weekly - 2 sets - 10 reps - 3 hold - Standing Row with Anchored Resistance  - 2 x daily - 7 x weekly - 2 sets - 10 reps - 3 hold - Shoulder Extension with Resistance  - 2 x daily - 7 x weekly - 2 sets - 10 reps - 3 hold  ASSESSMENT:  CLINICAL IMPRESSION: Patient is a 72 y.o. male who was seen today for physical therapy evaluation and treatment for prehab for right reverse total shoulder replacement.Started the education on the protocol and what to expect, he had good questions and we answered these and went over the ROM limitations and the use of the sling and what to avoid, started the basic after surgery exercises so he knows what to do  OBJECTIVE IMPAIRMENTS: decreased activity tolerance, decreased endurance, decreased ROM, decreased strength, increased muscle spasms, impaired flexibility, impaired UE functional use, improper body mechanics, postural dysfunction, and pain.   REHAB POTENTIAL: Good  CLINICAL DECISION MAKING:  Stable/uncomplicated  EVALUATION COMPLEXITY: Low   GOALS: Goals reviewed with patient? Yes  SHORT TERM GOALS: Target date: 01/30/23  Independent with initial HEP Goal status: met 02/16/23  LONG TERM GOALS: Target date: 05/02/23  Understand posture and body mechanics Goal status: ongoing 02/16/23  2.  Understand safe gym program Goal status: progressing 02/16/23  3.  Decrease pain 25% with ADL's Goal status: met 03/02/23  4.  Increase abduction to 110 degrees Goal status: ongoing 02/23/23  PLAN:  PT FREQUENCY: 1-2x/week  PT DURATION: 8 weeks  PLANNED INTERVENTIONS: Therapeutic exercises, Therapeutic activity, Neuromuscular re-education, Balance training, Gait training, Patient/Family education, Self Care, Joint mobilization, Joint manipulation, Dry Needling, Electrical stimulation, Cryotherapy, Moist heat, Taping, Vasopneumatic device, Ultrasound, and Manual therapy  PLAN FOR NEXT SESSION: Go over the protocol for his recovery, D/C   Jearld Lesch,  PT 03/02/2023, 2:42 PM

## 2023-03-08 ENCOUNTER — Ambulatory Visit: Payer: PPO | Admitting: Physical Therapy

## 2023-03-08 ENCOUNTER — Encounter: Payer: Self-pay | Admitting: Physical Therapy

## 2023-03-08 DIAGNOSIS — M6281 Muscle weakness (generalized): Secondary | ICD-10-CM

## 2023-03-08 DIAGNOSIS — M25511 Pain in right shoulder: Secondary | ICD-10-CM | POA: Diagnosis not present

## 2023-03-08 DIAGNOSIS — G8929 Other chronic pain: Secondary | ICD-10-CM

## 2023-03-08 NOTE — Therapy (Signed)
OUTPATIENT PHYSICAL THERAPY SHOULDER TREATMENT   Patient Name: George Gardner MRN: [id number] DOB:[birth year], 72 y.o., male Today's Date: 03/08/2023  END OF SESSION:  PT End of Session - 03/08/23 1405     Visit Number 6    Date for PT Re-Evaluation 04/28/23    Authorization Type HTA    PT Start Time 1402    PT Stop Time 1445    PT Time Calculation (min) 43 min    Activity Tolerance Patient tolerated treatment well    Behavior During Therapy WFL for tasks assessed/performed             Past Medical History:  Diagnosis Date   Atrial fibrillation (HCC)    Attention deficit disorder    BPH (benign prostatic hyperplasia)    Carpal tunnel syndrome    Complication of anesthesia    shaking uncontrollably, very anxious, pain   Dysrhythmia    never identified with halter monitor   Epididymitis    H/O eye surgery    Hearing loss    History of torn meniscus of left knee    Hypercholesteremia    Impingement syndrome, shoulder, right    OSA (obstructive sleep apnea)    mild-didn't tolerate cpap   Palpitations    Pneumonia    Sleep apnea    Tinnitus    Vitreous detachment of right eye    Past Surgical History:  Procedure Laterality Date   CARPAL TUNNEL RELEASE  09/11/2011   Procedure: CARPAL TUNNEL RELEASE;  Surgeon: Jacki Cones;  Location: [facility];  Service: Orthopedics;  Laterality: Right;   COLONOSCOPY  10/16/2011   EYE SURGERY  08/15/2011   rt cataract removal w iol implant   FOOT SURGERY  09/14/1969   HERNIA REPAIR Left 1998   again in 2000   TONSILLECTOMY  09/15/1959   yag laser surgery     Patient Active Problem List   Diagnosis Date Noted   Atrial fibrillation (HCC) 06/14/2012   Syncope 06/14/2012   Right carpal tunnel syndrome 09/11/2011    PCP: Joycelyn Rua, MD  REFERRING PROVIDER: Ramond Marrow, MD  REFERRING DIAG: prehab Right Reverse Total Shoulder  THERAPY DIAG:  Chronic right shoulder pain  Muscle weakness  (generalized)  Rationale for Evaluation and Treatment: Rehabilitation  ONSET DATE: 10/15/22  SUBJECTIVE:                                                                                                                                                                                      SUBJECTIVE STATEMENT: Patient had to cancel surgery as he will have to have it at [facility]  hospital and will need  cariology clearance PERTINENT HISTORY: See above  PAIN:  Are you having pain? Yes: NPRS scale: 2/10 Pain location: right anterior shoulder into the front of the upper arm and some posterior Pain description: ache, sharp and stabbing  Aggravating factors: movements of the arm 9-10/10 Relieving factors: takes antiinflammatory supplement and another supplement, rest helps  PRECAUTIONS: None  WEIGHT BEARING RESTRICTIONS: No  FALLS:  Has patient fallen in last 6 months? No  LIVING ENVIRONMENT: Lives with: lives with their family Lives in: House/apartment Stairs: No Has following equipment at home: None  OCCUPATION: retired  PLOF: Independent and cuts grass, some yardwork, goes to the gym 2x/week, runs 1/2 mile  PATIENT GOALS:be ready for surgery  NEXT MD VISIT:   OBJECTIVE:   DIAGNOSTIC FINDINGS:  Tear of the RC mms, bone spur  PATIENT SURVEYS:  FOTO 29  COGNITION: Overall cognitive status: Within functional limits for tasks assessed     SENSATION: WFL  POSTURE: Forward head, rounded shoulder  UPPER EXTREMITY ROM:   Active ROM Right Active eval Right  Passive eval  Shoulder flexion 110 P! 140  Shoulder extension    Shoulder abduction 60 P! 100  Shoulder adduction    Shoulder internal rotation 20 P! 20  Shoulder external rotation 60 P! 70  Elbow flexion    Elbow extension    Wrist flexion    Wrist extension    Wrist ulnar deviation    Wrist radial deviation    Wrist pronation    Wrist supination    (Blank rows = not tested)  UPPER EXTREMITY MMT:  MMT  Right eval Left eval  Shoulder flexion 3+ P!   Shoulder extension    Shoulder abduction 3 P!   Shoulder adduction    Shoulder internal rotation 3+ P!   Shoulder external rotation 4- P!   Middle trapezius    Lower trapezius    Elbow flexion    Elbow extension    Wrist flexion    Wrist extension    Wrist ulnar deviation    Wrist radial deviation    Wrist pronation    Wrist supination    Grip strength (lbs)    (Blank rows = not tested)  SHOULDER SPECIAL TESTS: Impingement tests: Neer impingement test: positive  and Painful arc test: positive  Rotator cuff assessment: Empty can test: positive   PALPATION:  Tender in the upper trap, biceps origin, rhomboid and teres   TODAY'S TREATMENT:                                                                                                                                         DATE: 03/08/23 Continued to go over the protocol to assure that he understands what to expect and the limitations in ROM and what to not do WE went over gym program and proper form, posture the actual motions, what to avoid.  Went over  HEP STM to the right upper trap, the rhomboid and the teres  03/02/23 WEnt over and educated in the protocol for after surgery went over the  ROM limitations and what to expect STM to the right upper trap, the right deltoid the rhomboid and the teres area with some gentle stretches  02/23/23 UBE level 3 x 6 minutes 25# rows 25# lats Red tband ER Red tband horizontal abduction very painful Red tband IR Serratus push 5# DN to the upper trap, deltoid and rhomboid STM to the above  02/16/23 Reviewed HEP, patient with a lot of questions.  Worked on posture and form and talked about safety DN to the upper trap, right rhomboids STM to the above Wall slides, flexion, scaption, circles 2# biceps and triceps Green tband extension and row Yellow tband ER and IR 2# serratus All right shoulder isometrics   02/03/23: Therapeutic  exercise: Initially reviewed and had patient demo the exercises from instruction at initial evaluation: Needed correction for hand position for B shoulder ER , was pronating and replicating impingement R shoulder.  Progressed with additional ex: Serratus punches, tried in standing with t band but painful, so added in supine, with 1 # wt,  Biceps curls, in a protected position, R elbow resting on thigh, curls with forearm supinated Triceps extension: patient demonstrated bent over triceps extension and performed correctly , advised to continue as needed  Instructed in pendulum R shoulder , with 1 to 2 # wt for pain relief, jt spacing as needed.  Provided with printed directions of all of the above.     PATIENT EDUCATION: Education details: POC/HEP Person educated: Patient Education method: Programmer, multimedia, Demonstration, Actor cues, Verbal cues, and Handouts Education comprehension: verbalized understanding, returned demonstration, verbal cues required, tactile cues required, and needs further education  HOME EXERCISE PROGRAM: Access Code: [email or web address] URL: [email or web address] Date: 01/26/2023 Prepared by: Stacie Glaze  Exercises - Shoulder External Rotation and Scapular Retraction with Resistance  - 2 x daily - 7 x weekly - 2 sets - 10 reps - 3 hold - Standing Row with Anchored Resistance  - 2 x daily - 7 x weekly - 2 sets - 10 reps - 3 hold - Shoulder Extension with Resistance  - 2 x daily - 7 x weekly - 2 sets - 10 reps - 3 hold  ASSESSMENT:  CLINICAL IMPRESSION: Patient is a 72 y.o. male who was seen today for physical therapy evaluation and treatment for prehab for right reverse total shoulder replacement.  The surgery has currently been cancelled he has to get the all clear from a cardiologist, he has that appointment next week.  We went over the HEP, the protocol and the exercises that he can do at the gym.  OBJECTIVE IMPAIRMENTS: decreased activity tolerance,  decreased endurance, decreased ROM, decreased strength, increased muscle spasms, impaired flexibility, impaired UE functional use, improper body mechanics, postural dysfunction, and pain.   REHAB POTENTIAL: Good  CLINICAL DECISION MAKING: Stable/uncomplicated  EVALUATION COMPLEXITY: Low   GOALS: Goals reviewed with patient? Yes  SHORT TERM GOALS: Target date: 01/30/23  Independent with initial HEP Goal status: met 02/16/23  LONG TERM GOALS: Target date: 05/02/23  Understand posture and body mechanics Goal status: met 03/08/23  2.  Understand safe gym program Goal status:met 03/08/23  3.  Decrease pain 25% with ADL's Goal status: met 03/02/23  4.  Increase abduction to 110 degrees Goal status:met 03/08/23  PLAN:  PT FREQUENCY: 1-2x/week  PT DURATION: 8 weeks  PLANNED INTERVENTIONS:  Therapeutic exercises, Therapeutic activity, Neuromuscular re-education, Balance training, Gait training, Patient/Family education, Self Care, Joint mobilization, Joint manipulation, Dry Needling, Electrical stimulation, Cryotherapy, Moist heat, Taping, Vasopneumatic device, Ultrasound, and Manual therapy  PLAN FOR NEXT SESSION: D/C goals met   Jearld Lesch, PT 03/08/2023, 2:05 PM

## 2023-03-10 ENCOUNTER — Ambulatory Visit (HOSPITAL_COMMUNITY): Admission: RE | Admit: 2023-03-10 | Payer: PPO | Source: Ambulatory Visit | Admitting: Orthopaedic Surgery

## 2023-03-10 ENCOUNTER — Encounter (HOSPITAL_COMMUNITY): Admission: RE | Payer: Self-pay | Source: Ambulatory Visit

## 2023-03-10 SURGERY — ARTHROPLASTY, SHOULDER, TOTAL, REVERSE
Anesthesia: General | Site: Shoulder | Laterality: Right

## 2023-03-15 ENCOUNTER — Ambulatory Visit: Payer: PPO | Admitting: Cardiology

## 2023-03-15 ENCOUNTER — Encounter: Payer: Self-pay | Admitting: Cardiology

## 2023-03-15 VITALS — BP 128/64 | HR 74 | Resp 16 | Ht 70.0 in | Wt 171.8 lb

## 2023-03-15 DIAGNOSIS — I480 Paroxysmal atrial fibrillation: Secondary | ICD-10-CM

## 2023-03-15 DIAGNOSIS — Z01810 Encounter for preprocedural cardiovascular examination: Secondary | ICD-10-CM

## 2023-03-15 DIAGNOSIS — G4730 Sleep apnea, unspecified: Secondary | ICD-10-CM

## 2023-03-15 MED ORDER — ASPIRIN 81 MG PO TBEC
81.0000 mg | DELAYED_RELEASE_TABLET | Freq: Every day | ORAL | 12 refills | Status: DC
Start: 2023-03-15 — End: 2023-04-26

## 2023-03-15 NOTE — Progress Notes (Signed)
ID:  George Gardner, DOB [birth year], MRN [id number]  PCP:  Rick Duff, PA-C  Cardiologist:  Tessa Lerner, DO, [facility] (established care 03/15/23) Former Cardiology Providers: Dr. Ladona Ridgel.   REASON FOR CONSULT: Pre-op  REQUESTING PHYSICIAN:  Rick Duff, PA-C [address] [place],  [place] [address]  Chief Complaint  Patient presents with   Pre-op Exam    HPI  George Gardner is a 72 y.o. Caucasian male who presents to the clinic for evaluation of pre-op at the request of Exie Parody. His past medical history and cardiovascular risk factors include: Sleep apnea not on device, paroxysmal atrial fibrillation, right bundle branch block.  Patient is referred to the practice for preoperative risk stratification prior to upcoming shoulder replacement surgery.  He is accompanied by his wife at today's office visit.  He was recently scheduled for reverse shoulder replacement preoperative EKG was considered to be abnormal and surgery was canceled and he was referred to cardiology for further evaluation and management.  The surgery is still to be determined but patient hopefully plans to have it done in September/October of this year.  Clinically denies anginal chest pain or heart failure symptoms.  Good functional capacity for age he keep himself active with yard work as he has to maintain a 2 acre land which involves walking/gardening, as needed cutting trees when needed and splinting what in the fall.  Review of EKGs illustrate a new right bundle branch block compared to October 2016.  He also has remote history of paroxysmal atrial fibrillation for which she saw Dr. Ladona Ridgel back in 2013/2014.  He is currently not on AV nodal blocking agents for thromboembolic prophylaxis.  He appreciates when he goes in and out of A-fib.  He does not use any smart watch technology to keep track of his A-fib burden.  CARDIAC DATABASE: EKG: 03/15/2023: Sinus Rhythm, 72bpm, LAD,  RBBB, LAFB.   Echocardiogram: No results found for this or any previous visit from the past 1095 days.   Stress Testing: No results found for this or any previous visit from the past 1095 days.   ALLERGIES: Allergies  Allergen Reactions   Naproxen Swelling    Redness and swelling in legs and feet   Asa [Aspirin]     Severe ringing of the ears   Codeine Other (See Comments)    disoriented   Penicillins Hives    MEDICATION LIST PRIOR TO VISIT: Current Meds  Medication Sig   L-ARGININE PO Take 5,000 mg by mouth daily.   latanoprost (XALATAN) 0.005 % ophthalmic solution Place 1 drop into both eyes at bedtime.   MAGNESIUM PO Take 1 tablet by mouth daily.   OVER THE COUNTER MEDICATION Take 2 capsules by mouth daily. Infla-650   OVER THE COUNTER MEDICATION Take 1 Scoop by mouth daily. Cardio For Life   sildenafil (REVATIO) 20 MG tablet Take 20 mg by mouth 3 (three) times daily.   sodium chloride (OCEAN) 0.65 % SOLN nasal spray Place 1 spray into both nostrils as needed for congestion.   Testosterone 20.25 MG/ACT (1.62%) GEL Apply 4 Pump topically daily.   Zinc Acetate, Oral, (ZINC ACETATE PO) Take 1 capsule by mouth daily.     PAST MEDICAL HISTORY: Past Medical History:  Diagnosis Date   Atrial fibrillation [facility])    Attention deficit disorder    BPH (benign prostatic hyperplasia)    Carpal tunnel syndrome    Complication of anesthesia    shaking  uncontrollably, very anxious, pain   Dysrhythmia    never identified with halter monitor   Epididymitis    H/O eye surgery    Hearing loss    History of torn meniscus of left knee    Hypercholesteremia    Impingement syndrome, shoulder, right    OSA (obstructive sleep apnea)    mild-didn't tolerate cpap   Palpitations    Pneumonia    Sleep apnea    Tinnitus    Vitreous detachment of right eye     PAST SURGICAL HISTORY: Past Surgical History:  Procedure Laterality Date   CARPAL TUNNEL RELEASE  09/11/2011   Procedure:  CARPAL TUNNEL RELEASE;  Surgeon: Jacki Cones;  Location: [facility];  Service: Orthopedics;  Laterality: Right;   COLONOSCOPY  10/16/2011   EYE SURGERY  08/15/2011   rt cataract removal w iol implant   FOOT SURGERY  09/14/1969   HERNIA REPAIR Left 1998   again in 2000   TONSILLECTOMY  09/15/1959   yag laser surgery      FAMILY HISTORY: The patient family history includes Arrhythmia in his mother; Colon cancer in his paternal uncle; Congestive Heart Failure in his mother; Heart disease in his maternal aunt; Osteoarthritis in his brother; Other in his brother, father, mother, and sister; Thyroid disease in his mother; Transient ischemic attack in his mother.  SOCIAL HISTORY:  The patient  reports that he quit smoking about 15 years ago. His smoking use included cigarettes. He has never used smokeless tobacco. He reports current alcohol use of about 3.0 standard drinks of alcohol per week. He reports that he does not use drugs.  REVIEW OF SYSTEMS: Review of Systems  Cardiovascular:  Negative for chest pain, claudication, dyspnea on exertion, irregular heartbeat, leg swelling, near-syncope, orthopnea, palpitations, paroxysmal nocturnal dyspnea and syncope.  Respiratory:  Negative for shortness of breath.   Hematologic/Lymphatic: Negative for bleeding problem.  Musculoskeletal:  Negative for muscle cramps and myalgias.  Neurological:  Negative for dizziness and light-headedness.    PHYSICAL EXAM:    03/15/2023    9:06 AM 03/15/2023    9:03 AM 02/25/2023   10:50 AM  Vitals with BMI  Height  5\' 10"  5\' 10"   Weight  171 lbs 13 oz 171 lbs  BMI  24.65 24.54  Systolic 128 147 161  Diastolic 64 65 78  Pulse 74 71 79    Physical Exam  Constitutional: No distress.  Age appropriate, hemodynamically stable.   HENT:  Hearing aids   Neck: No JVD present.  Cardiovascular: Normal rate, regular rhythm, S1 normal, S2 normal, intact distal pulses and normal pulses. Exam  reveals no gallop, no S3 and no S4.  No murmur heard. Pulses:      Dorsalis pedis pulses are 2+ on the right side and 2+ on the left side.       Posterior tibial pulses are 2+ on the right side and 2+ on the left side.  Pulmonary/Chest: Effort normal and breath sounds normal. No stridor. He has no wheezes. He has no rales.  Abdominal: Soft. Bowel sounds are normal. He exhibits no distension. There is no abdominal tenderness.  Musculoskeletal:        General: No edema.     Cervical back: Neck supple.  Neurological: He is alert and oriented to person, place, and time. He has intact cranial nerves (2-12).  Skin: Skin is warm and moist.    LABORATORY DATA:    Latest Ref Rng &  Units 02/25/2023   11:59 AM 09/11/2011   12:07 PM  CBC  WBC 4.0 - 10.5 K/uL 6.9    Hemoglobin 13.0 - 17.0 g/dL 16.1  09.6   Hematocrit 39.0 - 52.0 % 46.4    Platelets 150 - 400 K/uL 275         Latest Ref Rng & Units 02/25/2023   11:59 AM  CMP  Glucose 70 - 99 mg/dL 86   BUN 8 - 23 mg/dL 15   Creatinine 0.45 - 1.24 mg/dL 4.09   Sodium 811 - 914 mmol/L 136   Potassium 3.5 - 5.1 mmol/L 3.9   Chloride 98 - 111 mmol/L 104   CO2 22 - 32 mmol/L 25   Calcium 8.9 - 10.3 mg/dL 8.9     No results found for: "CHOL", "HDL", "LDLCALC", "LDLDIRECT", "TRIG", "CHOLHDL" No components found for: "NTPROBNP" No results for input(s): "PROBNP" in the last 8760 hours. No results for input(s): "TSH" in the last 8760 hours.  BMP Recent Labs    02/25/23 1159  NA 136  K 3.9  CL 104  CO2 25  GLUCOSE 86  BUN 15  CREATININE 0.95  CALCIUM 8.9  GFRNONAA >60    HEMOGLOBIN A1C No results found for: "HGBA1C", "MPG"  IMPRESSION:    ICD-10-CM   1. Preop cardiovascular exam  Z01.810 EKG 12-Lead    PCV ECHOCARDIOGRAM COMPLETE    PCV MYOCARDIAL PERFUSION WO LEXISCAN    2. Paroxysmal atrial fibrillation (HCC)  I48.0 PCV ECHOCARDIOGRAM COMPLETE    PCV MYOCARDIAL PERFUSION WO LEXISCAN    3. Sleep apnea, unspecified type   G47.30        RECOMMENDATIONS: George Gardner is a 72 y.o. Caucasian male whose past medical history and cardiac risk factors include: Sleep apnea not on device, paroxysmal atrial fibrillation, right bundle branch block.  Preop cardiovascular exam Patient is being scheduled for reverse shoulder replacement.  The date of surgery is to be determined.  Refer to cardiology given the newly discovered right bundle branch block and history of paroxysmal atrial fibrillation for further risk stratification.  Given his age, newly discovered right bundle branch block, and A-fib the shared decision was to proceed with echo and stress test for further risk stratification.  Patient is able to exercise but given the right bundle branch block recommend exercise nuclear stress test for further evaluation Further recommendations forthcoming.  Paroxysmal atrial fibrillation (HCC) Rate control: N/A Rhythm control: N/A Thromboembolic prophylaxis: Recommending aspirin 81 mg p.o. daily Patient is willing to try baby aspirin p.o. daily.  Full dose aspirin appears to cause tinnitus for him. Diagnosed with A-fib back in 2013/2014.   He had seen Dr. Ladona Ridgel in the past. No recurrent episodes of A-fib according to the patient. Patient and wife understand that his CHA2DS2-VASc score would change with advanced age or additional comorbidities.  And therefore should be reevaluated at that time.  Sleep apnea, unspecified type Unable to tolerate device therapy. Reemphasized its importance-patient likely to reconsider.  Also given his age and I recommended that he have his fasting lipids checked.  And based on the numbers may consider pharmacological therapy if warranted.  He will have this done at PCPs office.  He will provide [place] a copy of the next visit  FINAL MEDICATION LIST END OF ENCOUNTER: No orders of the defined types were placed in this encounter.   There are no discontinued medications.   Current  Outpatient Medications:    L-ARGININE PO, Take 5,000  mg by mouth daily., Disp: , Rfl:    latanoprost (XALATAN) 0.005 % ophthalmic solution, Place 1 drop into both eyes at bedtime., Disp: , Rfl:    MAGNESIUM PO, Take 1 tablet by mouth daily., Disp: , Rfl:    OVER THE COUNTER MEDICATION, Take 2 capsules by mouth daily. Infla-650, Disp: , Rfl:    OVER THE COUNTER MEDICATION, Take 1 Scoop by mouth daily. Cardio For Life, Disp: , Rfl:    sildenafil (REVATIO) 20 MG tablet, Take 20 mg by mouth 3 (three) times daily., Disp: , Rfl:    sodium chloride (OCEAN) 0.65 % SOLN nasal spray, Place 1 spray into both nostrils as needed for congestion., Disp: , Rfl:    Testosterone 20.25 MG/ACT (1.62%) GEL, Apply 4 Pump topically daily., Disp: , Rfl:    Zinc Acetate, Oral, (ZINC ACETATE PO), Take 1 capsule by mouth daily., Disp: , Rfl:   Orders Placed This Encounter  Procedures   PCV MYOCARDIAL PERFUSION WO LEXISCAN   EKG 12-Lead   PCV ECHOCARDIOGRAM COMPLETE    There are no Patient Instructions on file for this visit.   --Continue cardiac medications as reconciled in final medication list. --Return in about 6 weeks (around 04/26/2023) for Follow up pre-op, Review test results. or sooner if needed. --Continue follow-up with your primary care physician regarding the management of your other chronic comorbid conditions.  Patient's questions and concerns were addressed to his satisfaction. He voices understanding of the instructions provided during this encounter.   This note was created using a voice recognition software as a result there may be grammatical errors inadvertently enclosed that do not reflect the nature of this encounter. Every attempt is made to correct such errors.  Tessa Lerner, [place], [facility]  Pager:  [phone] Office: [phone]

## 2023-03-25 ENCOUNTER — Other Ambulatory Visit: Payer: PPO

## 2023-03-25 DIAGNOSIS — I480 Paroxysmal atrial fibrillation: Secondary | ICD-10-CM

## 2023-03-25 DIAGNOSIS — Z01810 Encounter for preprocedural cardiovascular examination: Secondary | ICD-10-CM

## 2023-04-06 ENCOUNTER — Ambulatory Visit: Payer: PPO | Admitting: Internal Medicine

## 2023-04-06 ENCOUNTER — Ambulatory Visit: Payer: PPO

## 2023-04-08 LAB — COLOGUARD: COLOGUARD: NEGATIVE

## 2023-04-11 ENCOUNTER — Encounter: Payer: Self-pay | Admitting: Cardiology

## 2023-04-14 ENCOUNTER — Telehealth: Payer: Self-pay

## 2023-04-20 NOTE — Progress Notes (Signed)
Pt is aware of results and next appointment

## 2023-04-26 ENCOUNTER — Ambulatory Visit: Payer: PPO | Admitting: Cardiology

## 2023-04-26 ENCOUNTER — Encounter: Payer: Self-pay | Admitting: Cardiology

## 2023-04-26 VITALS — BP 122/78 | HR 81 | Resp 16 | Ht 70.0 in | Wt 170.0 lb

## 2023-04-26 DIAGNOSIS — G4730 Sleep apnea, unspecified: Secondary | ICD-10-CM

## 2023-04-26 DIAGNOSIS — I480 Paroxysmal atrial fibrillation: Secondary | ICD-10-CM

## 2023-04-26 DIAGNOSIS — Z01810 Encounter for preprocedural cardiovascular examination: Secondary | ICD-10-CM

## 2023-04-26 DIAGNOSIS — I4510 Unspecified right bundle-branch block: Secondary | ICD-10-CM

## 2023-04-26 MED ORDER — ASPIRIN 81 MG PO TBEC
81.0000 mg | DELAYED_RELEASE_TABLET | Freq: Every day | ORAL | Status: AC
Start: 2023-04-26 — End: 2023-07-25

## 2023-04-26 NOTE — Progress Notes (Signed)
ID:  George Gardner, DOB June 22, 1951, MRN [id number]  PCP:  Rick Duff, PA-C  Cardiologist:  Tessa Lerner, DO, [facility] (established care 03/15/23) Former Cardiology Providers: Dr. Ladona Ridgel.   Date: 04/26/23 Last Office Visit: 03/15/2023  Chief Complaint  Patient presents with   Follow-up    Pre-op      HPI  George Gardner is a 72 y.o. Caucasian male whose past medical history and cardiovascular risk factors include: Sleep apnea not on device, paroxysmal atrial fibrillation, right bundle branch block.  Patient was referred to the practice back in July 2024 for preoperative risk stratification prior to upcoming shoulder replacement surgery.  His surgery was canceled due to newly discovered right bundle branch block (when compared to his EKG in October 2016).   He also has a remote history of atrial fibrillation for which she saw Dr. Ladona Ridgel. Currently not on AV nodal blocking agents or thromboembolic prophylaxis.  He does appreciate when he goes in and out of A-fib (subjectively only).  His triggers are dehydration and caffeine. Does not use any smart watch technology / Kardia mobile to keep track of his A-fib burden.  At the last office visit shared decision was to proceed with echo and stress test to further risk stratify the patient.  He was also started on aspirin 81 mg p.o. daily given his CHA2DS2-VASc score and history of paroxysmal atrial fibrillation.  Since last office visit, he denies anginal chest pain or heart failure symptoms.  He is accompanied by his wife at today's visit.  Patient states that home blood pressures range between 120-130 mmHg.  He has not started aspirin 81 mg p.o. daily due to concerns for tendinitis.  And as mentioned above, there is no objective evidence that he goes into A-fib this is all subjectively driven.  Patient states that these episodes are very quick to resolve.  CARDIAC DATABASE: EKG: 03/15/2023: Sinus Rhythm, 72bpm, LAD, RBBB, LAFB.    Echocardiogram: 04/06/2023:  Normal LV systolic function with visual EF 60-65%. Left ventricle cavity is normal in size. Normal left ventricular wall thickness. Normal global  wall motion. Normal diastolic filling pattern, normal LAP.  An atrial septal aneurysm without a patent foramen ovale is present.  No significant valvular heart disease.  No prior study for comparison.    Stress Testing: Exercise nuclear stress test 03/25/2023: Mild diaphragmatic attenuation in the inferior wall. No ischemia.  Overall LV systolic function is normal without regional wall motion abnormalities. Stress LV EF: 55%.  Normal ECG stress. The patient exercised for 5 minutes and 39 seconds of a Bruce protocol, achieving approximately 7.05 METs. Baseline heart rate was 59 bpm. A maximum heart rate of 146 beats per minute was achieved, which is 99% MPHR. Non-specific T change and resting NSR, IRBBB and LAFB and peak exercise Complete rate related RBBB. Occasional PAC and rare PVC and no specific T change. Stress terminated due to THR and fatigue.  ed heart rate response. The heart rate response was normal. The baseline blood pressure was 160/90 mmHg and increased to 260/98 mmHg. The blood pressure response was hypertensive. No previous exam available for comparison. Low risk.    ALLERGIES: Allergies  Allergen Reactions   Naproxen Swelling    Redness and swelling in legs and feet   Asa [Aspirin]     Severe ringing of the ears   Codeine Other (See Comments)    disoriented   Penicillins Hives    MEDICATION LIST PRIOR TO VISIT: Current Meds  Medication Sig   acetaminophen (TYLENOL) 650 MG CR tablet Take 650 mg by mouth every 8 (eight) hours as needed for pain.   L-ARGININE PO Take 5,000 mg by mouth daily.   latanoprost (XALATAN) 0.005 % ophthalmic solution Place 1 drop into both eyes at bedtime.   MAGNESIUM PO Take 1 tablet by mouth daily.   OVER THE COUNTER MEDICATION Take 2 capsules by mouth daily.  Infla-650   OVER THE COUNTER MEDICATION Take 1 Scoop by mouth daily. Cardio For Life   sildenafil (REVATIO) 20 MG tablet Take 20 mg by mouth as needed.   sodium chloride (OCEAN) 0.65 % SOLN nasal spray Place 1 spray into both nostrils as needed for congestion.   Testosterone 20.25 MG/ACT (1.62%) GEL Apply 4 Pump topically daily.   Zinc Acetate, Oral, (ZINC ACETATE PO) Take 1 capsule by mouth daily.     PAST MEDICAL HISTORY: Past Medical History:  Diagnosis Date   Atrial fibrillation (HCC)    Attention deficit disorder    BPH (benign prostatic hyperplasia)    Carpal tunnel syndrome    Complication of anesthesia    shaking uncontrollably, very anxious, pain   Dysrhythmia    never identified with halter monitor   Epididymitis    H/O eye surgery    Hearing loss    History of torn meniscus of left knee    Hypercholesteremia    Impingement syndrome, shoulder, right    OSA (obstructive sleep apnea)    mild-didn't tolerate cpap   Palpitations    Pneumonia    Sleep apnea    Tinnitus    Vitreous detachment of right eye     PAST SURGICAL HISTORY: Past Surgical History:  Procedure Laterality Date   CARPAL TUNNEL RELEASE  09/11/2011   Procedure: CARPAL TUNNEL RELEASE;  Surgeon: Jacki Cones;  Location: [facility];  Service: Orthopedics;  Laterality: Right;   COLONOSCOPY  10/16/2011   EYE SURGERY  08/15/2011   rt cataract removal w iol implant   FOOT SURGERY  09/14/1969   HERNIA REPAIR Left 1998   again in 2000   TONSILLECTOMY  09/15/1959   yag laser surgery      FAMILY HISTORY: The patient family history includes Arrhythmia in his mother; Colon cancer in his paternal uncle; Congestive Heart Failure in his mother; Heart disease in his maternal aunt; Osteoarthritis in his brother; Other in his brother, father, mother, and sister; Thyroid disease in his mother; Transient ischemic attack in his mother.  SOCIAL HISTORY:  The patient  reports that he quit smoking  about 15 years ago. His smoking use included cigarettes. He has never used smokeless tobacco. He reports current alcohol use of about 3.0 standard drinks of alcohol per week. He reports that he does not use drugs.  REVIEW OF SYSTEMS: Review of Systems  Cardiovascular:  Negative for chest pain, claudication, dyspnea on exertion, irregular heartbeat, leg swelling, near-syncope, orthopnea, palpitations, paroxysmal nocturnal dyspnea and syncope.  Respiratory:  Negative for shortness of breath.   Hematologic/Lymphatic: Negative for bleeding problem.  Musculoskeletal:  Negative for muscle cramps and myalgias.  Neurological:  Negative for dizziness and light-headedness.    PHYSICAL EXAM:    04/26/2023   11:40 AM 03/15/2023    9:06 AM 03/15/2023    9:03 AM  Vitals with BMI  Height 5\' 10"   5\' 10"   Weight 170 lbs  171 lbs 13 oz  BMI 24.39  24.65  Systolic 122 128 621  Diastolic 78 64  65  Pulse 81 74 71    Physical Exam  Constitutional: No distress.  Age appropriate, hemodynamically stable.   HENT:  Hearing aids   Neck: No JVD present.  Cardiovascular: Normal rate, regular rhythm, S1 normal, S2 normal, intact distal pulses and normal pulses. Exam reveals no gallop, no S3 and no S4.  No murmur heard. Pulses:      Dorsalis pedis pulses are 2+ on the right side and 2+ on the left side.       Posterior tibial pulses are 2+ on the right side and 2+ on the left side.  Pulmonary/Chest: Effort normal and breath sounds normal. No stridor. He has no wheezes. He has no rales.  Abdominal: Soft. Bowel sounds are normal. He exhibits no distension. There is no abdominal tenderness.  Musculoskeletal:        General: No edema.     Cervical back: Neck supple.  Neurological: He is alert and oriented to person, place, and time. He has intact cranial nerves (2-12).  Skin: Skin is warm and moist.    LABORATORY DATA:    Latest Ref Rng & Units 02/25/2023   11:59 AM 09/11/2011   12:07 PM  CBC  WBC 4.0 -  10.5 K/uL 6.9    Hemoglobin 13.0 - 17.0 g/dL 29.5  62.1   Hematocrit 39.0 - 52.0 % 46.4    Platelets 150 - 400 K/uL 275         Latest Ref Rng & Units 02/25/2023   11:59 AM  CMP  Glucose 70 - 99 mg/dL 86   BUN 8 - 23 mg/dL 15   Creatinine 3.08 - 1.24 mg/dL 6.57   Sodium 846 - 962 mmol/L 136   Potassium 3.5 - 5.1 mmol/L 3.9   Chloride 98 - 111 mmol/L 104   CO2 22 - 32 mmol/L 25   Calcium 8.9 - 10.3 mg/dL 8.9     No results found for: "CHOL", "HDL", "LDLCALC", "LDLDIRECT", "TRIG", "CHOLHDL" No components found for: "NTPROBNP" No results for input(s): "PROBNP" in the last 8760 hours. No results for input(s): "TSH" in the last 8760 hours.  BMP Recent Labs    02/25/23 1159  NA 136  K 3.9  CL 104  CO2 25  GLUCOSE 86  BUN 15  CREATININE 0.95  CALCIUM 8.9  GFRNONAA >60    HEMOGLOBIN A1C No results found for: "HGBA1C", "MPG"  IMPRESSION:    ICD-10-CM   1. Preop cardiovascular exam  Z01.810     2. Paroxysmal atrial fibrillation (HCC)  I48.0     3. Sleep apnea, unspecified type  G47.30     4. RBBB  I45.10         RECOMMENDATIONS: George Gardner is a 72 y.o. Caucasian male whose past medical history and cardiac risk factors include: Sleep apnea not on device, paroxysmal atrial fibrillation, right bundle branch block.  Preop cardiovascular exam Being considered for right reverse total shoulder replacement.  Date to be determined. Denies anginal chest pain or heart failure symptoms. Echocardiogram notes preserved LVEF, see report for additional details. MPI: Low risk study. Letter has already been sent to his provider back in July 2024.  Paroxysmal atrial fibrillation (HCC) Rate control: N/A. Rhythm control: N/A. Thromboembolic prophylaxis: Aspirin 81 mg p.o. daily. Diagnosed with A-fib back in 2013/2014.  Has seen Dr. Ladona Ridgel in the past. Triggers are usually dehydration or excessive caffeine intake.  Patient endorses that he appreciates when he goes in and  out of A-fib  and these episodes are very quick.  No underlying EKG or strips during these events to justify it being A-fib. I have asked him to consider smart watch technology which is FDA approved for A-fib monitoring or consider a Kardia mobile as an alternative as well.  Click Here to Calculate/Change CHADS2VASc Score The patient's CHADS2-VASc score is 1, indicating a 0.6% annual risk of stroke.   CHF History: No HTN History: No Diabetes History: No Stroke History: No Vascular Disease History: No  Patient understand that if he develops new chronic comorbid conditions it may change his CHA2DS2-VASc score and we may need to discuss anticoagulation for thromboembolic prophylaxis.  Patient is noted to have hypertensive response to exercise.  I have asked him to keep a log of his blood pressures to see if he truly has benign essential hypertension.  If so he can follow-up with either myself or PCP for medication initiation.    Patient had many questions with regards to right bundle branch block, chronic fatigue, testosterone supplementation.  I addressed them within the scope of my practice.   FINAL MEDICATION LIST END OF ENCOUNTER: No orders of the defined types were placed in this encounter.   Medications Discontinued During This Encounter  Medication Reason   aspirin EC 81 MG tablet      Current Outpatient Medications:    acetaminophen (TYLENOL) 650 MG CR tablet, Take 650 mg by mouth every 8 (eight) hours as needed for pain., Disp: , Rfl:    L-ARGININE PO, Take 5,000 mg by mouth daily., Disp: , Rfl:    latanoprost (XALATAN) 0.005 % ophthalmic solution, Place 1 drop into both eyes at bedtime., Disp: , Rfl:    MAGNESIUM PO, Take 1 tablet by mouth daily., Disp: , Rfl:    OVER THE COUNTER MEDICATION, Take 2 capsules by mouth daily. Infla-650, Disp: , Rfl:    OVER THE COUNTER MEDICATION, Take 1 Scoop by mouth daily. Cardio For Life, Disp: , Rfl:    sildenafil (REVATIO) 20 MG tablet, Take 20  mg by mouth as needed., Disp: , Rfl:    sodium chloride (OCEAN) 0.65 % SOLN nasal spray, Place 1 spray into both nostrils as needed for congestion., Disp: , Rfl:    Testosterone 20.25 MG/ACT (1.62%) GEL, Apply 4 Pump topically daily., Disp: , Rfl:    Zinc Acetate, Oral, (ZINC ACETATE PO), Take 1 capsule by mouth daily., Disp: , Rfl:   No orders of the defined types were placed in this encounter.   There are no Patient Instructions on file for this visit.   --Continue cardiac medications as reconciled in final medication list. --Return in about 6 months (around 10/27/2023) for Follow up Hx of Afib, RBBB, . or sooner if needed. --Continue follow-up with your primary care physician regarding the management of your other chronic comorbid conditions.  Patient's questions and concerns were addressed to his satisfaction. He voices understanding of the instructions provided during this encounter.   This note was created using a voice recognition software as a result there may be grammatical errors inadvertently enclosed that do not reflect the nature of this encounter. Every attempt is made to correct such errors.  Tessa Lerner, [place], [facility]  Pager:  [phone] Office: [phone]

## 2023-05-19 NOTE — Telephone Encounter (Signed)
Called patient to make him aware he is clear for surgery and to hold aspirin 7 days prior pt didn't answer left vm

## 2023-06-08 ENCOUNTER — Encounter: Payer: Self-pay | Admitting: Physical Therapy

## 2023-06-08 ENCOUNTER — Ambulatory Visit: Payer: PPO | Attending: Orthopaedic Surgery | Admitting: Physical Therapy

## 2023-06-08 DIAGNOSIS — G8929 Other chronic pain: Secondary | ICD-10-CM | POA: Insufficient documentation

## 2023-06-08 DIAGNOSIS — M25511 Pain in right shoulder: Secondary | ICD-10-CM | POA: Diagnosis present

## 2023-06-08 DIAGNOSIS — M6281 Muscle weakness (generalized): Secondary | ICD-10-CM | POA: Diagnosis present

## 2023-06-08 NOTE — Therapy (Signed)
OUTPATIENT PHYSICAL THERAPY SHOULDER EVALUATION   Patient Name: George Gardner MRN: [id number] DOB:[birth year], 72 y.o., male Today's Date: 06/08/2023  END OF SESSION:  PT End of Session - 06/08/23 1316     Visit Number 1    Date for PT Re-Evaluation 09/07/23    Authorization Type HTA    PT Start Time 1312    PT Stop Time 1400    PT Time Calculation (min) 48 min    Activity Tolerance Patient tolerated treatment well    Behavior During Therapy WFL for tasks assessed/performed              Past Medical History:  Diagnosis Date   Atrial fibrillation (HCC)    Attention deficit disorder    BPH (benign prostatic hyperplasia)    Carpal tunnel syndrome    Complication of anesthesia    shaking uncontrollably, very anxious, pain   Dysrhythmia    never identified with halter monitor   Epididymitis    H/O eye surgery    Hearing loss    History of torn meniscus of left knee    Hypercholesteremia    Impingement syndrome, shoulder, right    OSA (obstructive sleep apnea)    mild-didn't tolerate cpap   Palpitations    Pneumonia    Sleep apnea    Tinnitus    Vitreous detachment of right eye    Past Surgical History:  Procedure Laterality Date   CARPAL TUNNEL RELEASE  09/11/2011   Procedure: CARPAL TUNNEL RELEASE;  Surgeon: Jacki Cones;  Location: [facility];  Service: Orthopedics;  Laterality: Right;   COLONOSCOPY  10/16/2011   EYE SURGERY  08/15/2011   rt cataract removal w iol implant   FOOT SURGERY  09/14/1969   HERNIA REPAIR Left 1998   again in 2000   TONSILLECTOMY  09/15/1959   yag laser surgery     Patient Active Problem List   Diagnosis Date Noted   Atrial fibrillation (HCC) 06/14/2012   Syncope 06/14/2012   Right carpal tunnel syndrome 09/11/2011    PCP: Joycelyn Rua, MD  REFERRING PROVIDER: Ramond Marrow, MD  REFERRING DIAG: prehab Right Reverse Total Shoulder  THERAPY DIAG:  Chronic right shoulder pain  Muscle weakness  (generalized)  Rationale for Evaluation and Treatment: Rehabilitation  ONSET DATE: 10/15/22  SUBJECTIVE:                                                                                                                                                                                      SUBJECTIVE STATEMENT: Patient with right shoulder pain for about 8 years, he started having pain and spasms.  He popped  a tendon on January 9th doing flies.  He is planned to have a reverse total shoulder replacement for 07/07/2023.  I saw him back in the early summer but he had the surgery cancelled due to a heart issue, he is now having some cellulitis issues from a bug bite in the groin Hand dominance: Right  PERTINENT HISTORY: See above  PAIN:  Are you having pain? Yes: NPRS scale: 2/10 Pain location: right anterior shoulder into the front of the upper arm and some posterior Pain description: ache, sharp and stabbing  Aggravating factors: movements of the arm 9-10/10 Relieving factors: takes antiinflammatory supplement and another supplement, rest helps  PRECAUTIONS: None  WEIGHT BEARING RESTRICTIONS: No  FALLS:  Has patient fallen in last 6 months? No  LIVING ENVIRONMENT: Lives with: lives with their family Lives in: House/apartment Stairs: No Has following equipment at home: None  OCCUPATION: retired  PLOF: Independent and cuts grass, some yardwork, goes to the gym 2x/week, runs 1/2 mile  PATIENT GOALS:be ready for surgery  NEXT MD VISIT:   OBJECTIVE:   DIAGNOSTIC FINDINGS:  Tear of the RC mms, bone spur  COGNITION: Overall cognitive status: Within functional limits for tasks assessed     SENSATION: WFL  POSTURE: Forward head, rounded shoulder  UPPER EXTREMITY ROM:   Active ROM Right Active eval Right  Passive eval  Shoulder flexion 120 P! 140  Shoulder extension    Shoulder abduction 100 P! 120  Shoulder adduction    Shoulder internal rotation 0 P! 20P!   Shoulder external rotation 60 P! 70  Elbow flexion    Elbow extension    Wrist flexion    Wrist extension    Wrist ulnar deviation    Wrist radial deviation    Wrist pronation    Wrist supination    (Blank rows = not tested)  UPPER EXTREMITY MMT:  MMT Right eval Left eval  Shoulder flexion 3+ P!   Shoulder extension    Shoulder abduction 3 P!   Shoulder adduction    Shoulder internal rotation 3+ P!   Shoulder external rotation 4- P!   Middle trapezius    Lower trapezius    Elbow flexion    Elbow extension    Wrist flexion    Wrist extension    Wrist ulnar deviation    Wrist radial deviation    Wrist pronation    Wrist supination    Grip strength (lbs)    (Blank rows = not tested)  SHOULDER SPECIAL TESTS: Impingement tests: Neer impingement test: positive  and Painful arc test: positive  Rotator cuff assessment: Empty can test: positive   PALPATION:  Tender in the upper trap, biceps origin, rhomboid and teres and the deltoid   TODAY'S TREATMENT:  DATE:  06/08/23 STM to knots in the upper trap, neck and deltoid   PATIENT EDUCATION: Education details: POC/HEP Person educated: Patient Education method: Explanation, Demonstration, Tactile cues, Verbal cues, and Handouts Education comprehension: verbalized understanding, returned demonstration, verbal cues required, tactile cues required, and needs further education  HOME EXERCISE PROGRAM: Access Code: [email or web address] URL: [email or web address] Date: 01/26/2023 Prepared by: Stacie Glaze  Exercises - Shoulder External Rotation and Scapular Retraction with Resistance  - 2 x daily - 7 x weekly - 2 sets - 10 reps - 3 hold - Standing Row with Anchored Resistance  - 2 x daily - 7 x weekly - 2 sets - 10 reps - 3 hold - Shoulder Extension with Resistance  - 2 x daily - 7 x  weekly - 2 sets - 10 reps - 3 hold  ASSESSMENT:  CLINICAL IMPRESSION: Patient is a 72 y.o. male who was seen today for physical therapy evaluation and treatment for prehab for right reverse total shoulder replacement.  He has a lot of spasms in the right rhomboid, the upper trap and the biceps  and deltoid area. He has some ROM limitations but is better than when I measured in May, except for IR is worse.  Surgery was scheduled for June 26th, but was cancelled due to heart issue, he now has some cellulitis, the surgery is now scheduled for Oct 23.  OBJECTIVE IMPAIRMENTS: decreased activity tolerance, decreased endurance, decreased ROM, decreased strength, increased muscle spasms, impaired flexibility, impaired UE functional use, improper body mechanics, postural dysfunction, and pain.   REHAB POTENTIAL: Good  CLINICAL DECISION MAKING: Stable/uncomplicated  EVALUATION COMPLEXITY: Low   GOALS: Goals reviewed with patient? Yes  SHORT TERM GOALS: Target date: 06/15/23  Independent with initial HEP Goal status: INITIAL  LONG TERM GOALS: Target date: 08/08/2423  Understand posture and body mechanics Goal status: INITIAL  2.  Understand safe gym program Goal status: INITIAL  3.  Decrease pain 25% with ADL's Goal status: INITIAL  4.  Increase abduction to 110 degrees Goal status: INITIAL  PLAN:  PT FREQUENCY: 1-2x/week  PT DURATION: 8 weeks  PLANNED INTERVENTIONS: Therapeutic exercises, Therapeutic activity, Neuromuscular re-education, Balance training, Gait training, Patient/Family education, Self Care, Joint mobilization, Joint manipulation, Dry Needling, Electrical stimulation, Cryotherapy, Moist heat, Taping, Vasopneumatic device, Ultrasound, and Manual therapy  PLAN FOR NEXT SESSION: slow gentle scapular and RC stabilization, re-enforce form and safety   Mccauley Diehl W, PT 06/08/2023, 1:17 PM

## 2023-06-22 ENCOUNTER — Ambulatory Visit: Payer: PPO | Attending: Orthopaedic Surgery | Admitting: Physical Therapy

## 2023-06-22 ENCOUNTER — Encounter: Payer: Self-pay | Admitting: Physical Therapy

## 2023-06-22 DIAGNOSIS — G8929 Other chronic pain: Secondary | ICD-10-CM | POA: Diagnosis present

## 2023-06-22 DIAGNOSIS — M6281 Muscle weakness (generalized): Secondary | ICD-10-CM | POA: Insufficient documentation

## 2023-06-22 DIAGNOSIS — M25511 Pain in right shoulder: Secondary | ICD-10-CM | POA: Insufficient documentation

## 2023-06-22 NOTE — Progress Notes (Signed)
Anesthesia Review:  PCP: Cardiologist : Sunit Tolia LOV 04/26/23 preop  Chest x-ray : EKG : 03/15/23  Echo : 04/10/23  Stress test: 03/25/23  Cardiac Cath :  Activity level:  Sleep Study/ CPAP : Fasting Blood Sugar :      / Checks Blood Sugar -- times a day:   Blood Thinner/ Instructions /Last Dose: ASA / Instructions/ Last Dose :    81 mg aspirin    06/07/23- Cellulitis groin in ED  06/07/23- cbc/d and cmp

## 2023-06-22 NOTE — Patient Instructions (Signed)
SURGICAL WAITING ROOM VISITATION  Patients having surgery or a procedure may have no more than 2 support people in the waiting area - these visitors may rotate.    Children under the age of 70 must have an adult with them who is not the patient.  Due to an increase in RSV and influenza rates and associated hospitalizations, children ages 91 and under may not visit patients in [facility] hospitals.  If the patient needs to stay at the hospital during part of their recovery, the visitor guidelines for inpatient rooms apply. Pre-op nurse will coordinate an appropriate time for 1 support person to accompany patient in pre-op.  This support person may not rotate.    Please refer to the [facility] website for the visitor guidelines for Inpatients (after your surgery is over and you are in a regular room).       Your procedure is scheduled on:  07/07/23    Report to [facility] Main Entrance    Report to admitting at  0515 AM   Call this number if you have problems the morning of surgery [phone]   Do not eat food :After Midnight.   After Midnight you may have the following liquids until __ 0430____ AM DAY OF SURGERY  Water Non-Citrus Juices (without pulp, NO RED-Apple, White grape, White cranberry) Black Coffee (NO MILK/CREAM OR CREAMERS, sugar ok)  Clear Tea (NO MILK/CREAM OR CREAMERS, sugar ok) regular and decaf                             Plain Jell-O (NO RED)                                           Fruit ices (not with fruit pulp, NO RED)                                     Popsicles (NO RED)                                                               Sports drinks like Gatorade (NO RED)             ry.                If you have questions, please contact your surgeon's office.    Oral Hygiene is also important to reduce your risk of infection.                                    Remember - BRUSH YOUR TEETH THE MORNING OF SURGERY WITH YOUR REGULAR  TOOTHPASTE  DENTURES WILL BE REMOVED PRIOR TO SURGERY PLEASE DO NOT APPLY "Poly grip" OR ADHESIVES!!!   Do NOT smoke after Midnight   Stop all vitamins and herbal supplements 7 days before surgery.   Take these medicines the morning of surgery with A SIP OF WATER:  nasal spray   DO NOT TAKE ANY ORAL DIABETIC MEDICATIONS  DAY OF YOUR SURGERY  Bring CPAP mask and tubing day of surgery.                              You may not have any metal on your body including hair pins, jewelry, and body piercing             Do not wear make-up, lotions, powders, perfumes/cologne, or deodorant  Do not wear nail polish including gel and S&S, artificial/acrylic nails, or any other type of covering on natural nails including finger and toenails. If you have artificial nails, gel coating, etc. that needs to be removed by a nail salon please have this removed prior to surgery or surgery may need to be canceled/ delayed if the surgeon/ anesthesia feels like they are unable to be safely monitored.   Do not shave  48 hours prior to surgery.               Men may shave face and neck.   Do not bring valuables to the hospital. [facility] IS NOT             RESPONSIBLE   FOR VALUABLES.   Contacts, glasses, dentures or bridgework may not be worn into surgery.   Bring small overnight bag day of surgery.   DO NOT BRING YOUR HOME MEDICATIONS TO THE HOSPITAL. PHARMACY WILL DISPENSE MEDICATIONS LISTED ON YOUR MEDICATION LIST TO YOU DURING YOUR ADMISSION IN THE HOSPITAL!    Patients discharged on the day of surgery will not be allowed to drive home.  Someone NEEDS to stay with you for the first 24 hours after anesthesia.   Special Instructions: Bring a copy of your healthcare power of attorney and living will documents the day of surgery if you haven't scanned them before.              Please read over the following fact sheets you were given: IF YOU HAVE QUESTIONS ABOUT YOUR PRE-OP INSTRUCTIONS PLEASE CALL  [phone]   If you received a COVID test during your pre-op visit  it is requested that you wear a mask when out in public, stay away from anyone that may not be feeling well and notify your surgeon if you develop symptoms. If you test positive for Covid or have been in contact with anyone that has tested positive in the last 10 days please notify you surgeon.      Pre-operative 5 CHG Bath Instructions   You can play a key role in reducing the risk of infection after surgery. Your skin needs to be as free of germs as possible. You can reduce the number of germs on your skin by washing with CHG (chlorhexidine gluconate) soap before surgery. CHG is an antiseptic soap that kills germs and continues to kill germs even after washing.   DO NOT use if you have an allergy to chlorhexidine/CHG or antibacterial soaps. If your skin becomes reddened or irritated, stop using the CHG and notify one of our RNs at [phone].   Please shower with the CHG soap starting 4 days before surgery using the following schedule:     Please keep in mind the following:  DO NOT shave, including legs and underarms, starting the day of your first shower.   You may shave your face at any point before/day of surgery.  Place clean sheets on your bed the day you start using CHG soap. Use a clean  washcloth (not used since being washed) for each shower. DO NOT sleep with pets once you start using the CHG.   CHG Shower Instructions:  If you choose to wash your hair and private area, wash first with your normal shampoo/soap.  After you use shampoo/soap, rinse your hair and body thoroughly to remove shampoo/soap residue.  Turn the water OFF and apply about 3 tablespoons (45 ml) of CHG soap to a CLEAN washcloth.  Apply CHG soap ONLY FROM YOUR NECK DOWN TO YOUR TOES (washing for 3-5 minutes)  DO NOT use CHG soap on face, private areas, open wounds, or sores.  Pay special attention to the area where your surgery is being  performed.  If you are having back surgery, having someone wash your back for you may be helpful. Wait 2 minutes after CHG soap is applied, then you may rinse off the CHG soap.  Pat dry with a clean towel  Put on clean clothes/pajamas   If you choose to wear lotion, please use ONLY the CHG-compatible lotions on the back of this paper.     Additional instructions for the day of surgery: DO NOT APPLY any lotions, deodorants, cologne, or perfumes.   Put on clean/comfortable clothes.  Brush your teeth.  Ask your nurse before applying any prescription medications to the skin.      CHG Compatible Lotions   Aveeno Moisturizing lotion  Cetaphil Moisturizing Cream  Cetaphil Moisturizing Lotion  Clairol Herbal Essence Moisturizing Lotion, Dry Skin  Clairol Herbal Essence Moisturizing Lotion, Extra Dry Skin  Clairol Herbal Essence Moisturizing Lotion, Normal Skin  Curel Age Defying Therapeutic Moisturizing Lotion with Alpha Hydroxy  Curel Extreme Care Body Lotion  Curel Soothing Hands Moisturizing Hand Lotion  Curel Therapeutic Moisturizing Cream, Fragrance-Free  Curel Therapeutic Moisturizing Lotion, Fragrance-Free  Curel Therapeutic Moisturizing Lotion, Original Formula  Eucerin Daily Replenishing Lotion  Eucerin Dry Skin Therapy Plus Alpha Hydroxy Crme  Eucerin Dry Skin Therapy Plus Alpha Hydroxy Lotion  Eucerin Original Crme  Eucerin Original Lotion  Eucerin Plus Crme Eucerin Plus Lotion  Eucerin TriLipid Replenishing Lotion  Keri Anti-Bacterial Hand Lotion  Keri Deep Conditioning Original Lotion Dry Skin Formula Softly Scented  Keri Deep Conditioning Original Lotion, Fragrance Free Sensitive Skin Formula  Keri Lotion Fast Absorbing Fragrance Free Sensitive Skin Formula  Keri Lotion Fast Absorbing Softly Scented Dry Skin Formula  Keri Original Lotion  Keri Skin Renewal Lotion Keri Silky Smooth Lotion  Keri Silky Smooth Sensitive Skin Lotion  Nivea Body Creamy Conditioning  Oil  Nivea Body Extra Enriched Lotion  Nivea Body Original Lotion  Nivea Body Sheer Moisturizing Lotion Nivea Crme  Nivea Skin Firming Lotion  NutraDerm 30 Skin Lotion  NutraDerm Skin Lotion  NutraDerm Therapeutic Skin Cream  NutraDerm Therapeutic Skin Lotion  ProShield Protective Hand Cream  Provon moisturizing lotion   [facility]- Preparing for Total Shoulder Arthroplasty    Before surgery, you can play an important role. Because skin is not sterile, your skin needs to be as free of germs as possible. You can reduce the number of germs on your skin by using the following products. Benzoyl Peroxide Gel Reduces the number of germs present on the skin Applied twice a day to shoulder area starting two days before surgery    ==================================================================  Please follow these instructions carefully:  BENZOYL PEROXIDE 5% GEL  Please do not use if you have an allergy to benzoyl peroxide.   If your skin becomes reddened/irritated stop using the benzoyl peroxide.  Starting two days before surgery, apply as follows: Apply benzoyl peroxide in the morning and at night. Apply after taking a shower. If you are not taking a shower clean entire shoulder front, back, and side along with the armpit with a clean wet washcloth.  Place a quarter-sized dollop on your shoulder and rub in thoroughly, making sure to cover the front, back, and side of your shoulder, along with the armpit.   2 days before ____ AM   ____ PM              1 day before ____ AM   ____ PM                         Do this twice a day for two days.  (Last application is the night before surgery, AFTER using the CHG soap as described below).  Do NOT apply benzoyl peroxide gel on the day of surgery.
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
## 2023-06-24 NOTE — H&P (Signed)
PREOPERATIVE H&P  Chief Complaint: right shoulder Degenerative joint disease  HPI: George Gardner is a 72 y.o. male who is scheduled for Procedure(s): REVERSE SHOULDER ARTHROPLASTY.   Patient has a past medical history significant for atrial fibrillation, OSA.   Patient had a known cuff tear 7 years ago.  He avoided surgery.  He wanted to get back to activities.  He has been in a gym.  He had manipulations with his chiropractor. His chiropractor recommended [place] for his shoulder after he had a pop while he was lifting weights.  He has had an acute on chronic type injury.  He is not able to raise his arm with any weight.  He has pain with any resistive strength exercises.    Symptoms are rated as moderate to severe, and have been worsening.  This is significantly impairing activities of daily living.    Please see clinic note for further details on this patient's care.    He has elected for surgical management.   Past Medical History:  Diagnosis Date   Atrial fibrillation (HCC)    Attention deficit disorder    BPH (benign prostatic hyperplasia)    Carpal tunnel syndrome    Complication of anesthesia    shaking uncontrollably, very anxious, pain   Dysrhythmia    never identified with halter monitor   Epididymitis    H/O eye surgery    Hearing loss    History of torn meniscus of left knee    Hypercholesteremia    Impingement syndrome, shoulder, right    OSA (obstructive sleep apnea)    mild-didn't tolerate cpap   Palpitations    Pneumonia    Sleep apnea    Tinnitus    Vitreous detachment of right eye    Past Surgical History:  Procedure Laterality Date   CARPAL TUNNEL RELEASE  09/11/2011   Procedure: CARPAL TUNNEL RELEASE;  Surgeon: Jacki Cones;  Location: [facility];  Service: Orthopedics;  Laterality: Right;   COLONOSCOPY  10/16/2011   EYE SURGERY  08/15/2011   rt cataract removal w iol implant   FOOT SURGERY  09/14/1969   HERNIA REPAIR Left  1998   again in 2000   TONSILLECTOMY  09/15/1959   yag laser surgery     Social History   Socioeconomic History   Marital status: Married    Spouse name: Not on file   Number of children: Not on file   Years of education: Not on file   Highest education level: Not on file  Occupational History   Not on file  Tobacco Use   Smoking status: Former    Current packs/day: 0.00    Types: Cigarettes    Quit date: 03/03/2008    Years since quitting: 15.3   Smokeless tobacco: Never  Substance and Sexual Activity   Alcohol use: Yes    Alcohol/week: 3.0 standard drinks of alcohol    Types: 3 Cans of beer per week    Comment: every night   Drug use: No   Sexual activity: Not on file  Other Topics Concern   Not on file  Social History Narrative   Married , 2 children   Social Determinants of Health   Financial Resource Strain: Low Risk  (11/18/2022)   Received from [facility], [facility]   Overall Financial Resource Strain (CARDIA)    Difficulty of Paying Living Expenses: Not hard at all  Food Insecurity: No Food Insecurity (11/18/2022)   Received  from [facility], [facility]   Hunger Vital Sign    Worried About Running Out of Food in the Last Year: Never true    Ran Out of Food in the Last Year: Never true  Transportation Needs: No Transportation Needs (11/18/2022)   Received from [facility], [facility]   [facility] - Transportation    Lack of Transportation (Medical): No    Lack of Transportation (Non-Medical): No  Physical Activity: Not on file  Stress: Not on file  Social Connections: Unknown (10/02/2022)   Received from [facility], [facility]   Social Network    Social Network: Not on file   Family History  Problem Relation Age of Onset   Arrhythmia Mother    Thyroid disease Mother    Other Mother        thyroidectomy   Transient ischemic attack Mother    Congestive Heart Failure Mother    Other Father        sepsis   Other Sister         pacemaker,hip replacement   Osteoarthritis Brother        knees   Other Brother        back pain   Heart disease Maternal Aunt        CHF, colon cancer   Colon cancer Paternal Uncle    Allergies  Allergen Reactions   Naproxen Swelling    Redness and swelling in legs and feet   Asa [Aspirin]     Severe ringing of the ears   Codeine Other (See Comments)    disoriented   Penicillins Hives   Prior to Admission medications   Medication Sig Start Date End Date Taking? Authorizing Provider  5-Hydroxytryptophan (5-HTP) 50 MG TABS Take 50 mg by mouth daily.   Yes [provider]  acetaminophen (TYLENOL) 650 MG CR tablet Take 650 mg by mouth every 8 (eight) hours as needed for pain.   Yes [provider]  Acetylcysteine (NAC 600) 600 MG CAPS Take 600 mg by mouth daily.   Yes [provider]  ASTAXANTHIN PO Take 10 mg by mouth daily.   Yes [provider]  Cholecalciferol (VITAMIN D3 MAXIMUM STRENGTH) 125 MCG (5000 UT) capsule Take 10,000 Units by mouth daily.   Yes [provider]  CRANBERRY PO Take 10,000 mg by mouth daily.   Yes [provider]  L-ARGININE PO Take 500 mg by mouth daily.   Yes [provider]  latanoprost (XALATAN) 0.005 % ophthalmic solution Place 1 drop into both eyes at bedtime.   Yes [provider]  MAGNESIUM MALATE PO Take 142 mg by mouth daily.   Yes [provider]  OVER THE COUNTER MEDICATION Take 2 capsules by mouth daily. Infla-650   Yes [provider]  OVER THE COUNTER MEDICATION Take 1 Scoop by mouth daily. Cardio For Life   Yes [provider]  OVER THE COUNTER MEDICATION Take 7 drops by mouth daily. Iosol liquid supplement   Yes [provider]  OVER THE COUNTER MEDICATION Take 1 capsule by mouth daily. Adrenal complex supplement   Yes [provider]  OVER THE COUNTER MEDICATION Take 3 capsules by mouth daily. Cyruta plus supplement   Yes  [provider]  OVER THE COUNTER MEDICATION Take 3 capsules by mouth daily. A-F betafood supplement   Yes [provider]  Probiotic Product (PROBIOTIC PO) Take 2 capsules by mouth daily.   Yes [provider]  sildenafil (REVATIO) 20 MG tablet Take 40 mg by mouth as needed.   Yes [provider]  sodium chloride (OCEAN) 0.65 % SOLN nasal spray Place 1 spray into both nostrils as needed for congestion.   Yes [provider]  Testosterone 20.25 MG/ACT (1.62%) GEL Apply 5 Pump topically daily.   Yes [provider]  VITAMIN K PO Take 1 capsule by mouth daily.   Yes [provider]  zinc gluconate 50 MG tablet Take 50 mg by mouth daily.   Yes [provider]  aspirin EC 81 MG tablet Take 1 tablet (81 mg total) by mouth daily. Swallow whole. Patient not taking: Reported on 06/16/2023 04/26/23 07/25/23  Tolia, Sunit, DO    ROS: All other systems have been reviewed and were otherwise negative with the exception of those mentioned in the HPI and as above.  Physical Exam: General: Alert, no acute distress Cardiovascular: No pedal edema Respiratory: No cyanosis, no use of accessory musculature GI: No organomegaly, abdomen is soft and non-tender Skin: No lesions in the area of chief complaint Neurologic: Sensation intact distally Psychiatric: Patient is competent for consent with normal mood and affect Lymphatic: No axillary or cervical lymphadenopathy  MUSCULOSKELETAL:  Range of motion of the shoulder is to about 150 degrees with circumduction.  He has pain with range of motion.  He has gross weakness with supraspinatus and infraspinatus testing.    Imaging: Reviewed MRI which demonstrates a significantly retracted delaminated tear of the supraspinatus and infraspinatus.  Retracted to the level of the glenoid.  There is some early signal in the humeral head, concerning for superior migration.    BMI: Estimated body mass  index is 24.39 kg/m as calculated from the following:   Height as of 04/26/23: 5\' 10"  (1.778 m).   Weight as of 04/26/23: 77.1 kg.  No results found for: "ALBUMIN" Diabetes: Patient does not have a diagnosis of diabetes.     Smoking Status:       Assessment: right shoulder Degenerative joint disease  Plan: Plan for Procedure(s): REVERSE SHOULDER ARTHROPLASTY  The risks benefits and alternatives were discussed with the patient including but not limited to the risks of nonoperative treatment, versus surgical intervention including infection, bleeding, nerve injury,  blood clots, cardiopulmonary complications, morbidity, mortality, among others, and they were willing to proceed.   We additionally specifically discussed risks of axillary nerve injury, infection, periprosthetic fracture, continued pain and longevity of implants prior to beginning procedure.    Patient will be closely monitored in PACU for medical stabilization and pain control. If found stable in PACU, patient may be discharged home with outpatient follow-up. If any concerns regarding patient's stabilization patient will be admitted for observation after surgery. The patient is planning to be discharged home with outpatient PT.   The patient acknowledged the explanation, agreed to proceed with the plan and consent was signed.   He received cardiac clearance from Dr. Odis Hollingshead, and clearance from his PCP, Dr. Barth Kirks.   Operative Plan: Right reverse total shoulder arthroplasty Discharge Medications: standard  DVT Prophylaxis: aspirin Physical Therapy: outpatient PT Special Discharge needs: Sling (should bring with him). IceMan   Vernetta Honey, PA-C  06/24/2023 2:58 PM

## 2023-06-25 ENCOUNTER — Encounter (HOSPITAL_COMMUNITY)
Admission: RE | Admit: 2023-06-25 | Discharge: 2023-06-25 | Disposition: A | Discharge status: Home / Self Care | Payer: PPO | Source: Ambulatory Visit | Attending: Physician Assistant | Admitting: Physician Assistant

## 2023-06-29 ENCOUNTER — Encounter: Payer: PPO | Admitting: Physical Therapy

## 2023-06-30 ENCOUNTER — Encounter (HOSPITAL_COMMUNITY): Admission: RE | Admit: 2023-06-30 | Payer: PPO | Source: Ambulatory Visit

## 2023-07-05 ENCOUNTER — Encounter: Payer: PPO | Admitting: Physical Therapy

## 2023-07-07 ENCOUNTER — Ambulatory Visit (HOSPITAL_COMMUNITY): Admit: 2023-07-07 | Payer: PPO | Admitting: Orthopaedic Surgery

## 2023-07-07 SURGERY — REVERSE SHOULDER ARTHROPLASTY
Anesthesia: General | Site: Shoulder | Laterality: Right

## 2023-10-26 ENCOUNTER — Ambulatory Visit: Payer: Self-pay | Admitting: Cardiology

## 2023-11-15 ENCOUNTER — Ambulatory Visit: Payer: PPO | Attending: Cardiology | Admitting: Cardiology

## 2023-11-15 ENCOUNTER — Encounter: Payer: Self-pay | Admitting: Cardiology

## 2023-11-15 VITALS — BP 138/80 | HR 78 | Resp 16 | Ht 70.0 in | Wt 170.8 lb

## 2023-11-15 DIAGNOSIS — I480 Paroxysmal atrial fibrillation: Secondary | ICD-10-CM | POA: Diagnosis not present

## 2023-11-15 DIAGNOSIS — I4510 Unspecified right bundle-branch block: Secondary | ICD-10-CM

## 2023-11-15 DIAGNOSIS — G4730 Sleep apnea, unspecified: Secondary | ICD-10-CM

## 2023-11-15 MED ORDER — ASPIRIN 81 MG PO TBEC: 81.0000 mg | DELAYED_RELEASE_TABLET | Freq: Every day | ORAL | Status: AC

## 2023-11-15 NOTE — Progress Notes (Signed)
 Cardiology Office Note:  .   Date:  11/15/2023  ID:  George Gardner, DOB Aug 30, 1951, MRN [id number] PCP:  Exie Parody  Former Cardiology Providers: Dr. Ladona Ridgel.  [facility] Providers Cardiologist:  Tessa Lerner, DO, [facility] (established care 03/15/23 ) Electrophysiologist:  Lewayne Bunting, MD  Electrophysiologist:  Lewayne Bunting, MD  Click to update primary MD,subspecialty MD or APP then REFRESH:1}    Chief Complaint  Patient presents with   Atrial Fibrillation   Follow-up    History of Present Illness: .   George Gardner is a 73 y.o. Caucasian male whose past medical history and cardiovascular risk factors includes: Sleep apnea not on device, paroxysmal atrial fibrillation, right bundle branch block.   Patient has history of paroxysmal atrial fibrillation.  Not on anticoagulation given his low CHA2DS2-VASc SCORE.  However, he was advised to be on aspirin 81 mg p.o. daily.  He remains reluctant due to subjective concerns for possible tinnitus.  Patient states he appreciates when he goes in and out of A-fib (subjective).  No associated rhythm strips or EKG.  In the past he has been educated on considering small watch technology or Lourena Simmonds mobile keep to keep track of his A-fib burden.  He presents today for follow-up.  Since last office visit patient states that his A-fib is very well-controlled.  He rarely has any episodes once every 2 or 3 months.  And when he does have atrial fibrillation that usually last for 5 seconds or less.  Patient wants to avoid AV nodal blocking agents for now for rate control strategy as his frequency of A-fib is quite low.  Currently not on aspirin 81 mg p.o. daily but willing to retry.  Patient wanted preoperative clearance for reverse shoulder surgery which was provided to him back in July 2024.  The patient has chose not to proceed forward.  Review of Systems: .   Review of Systems  Cardiovascular:  Negative for chest pain, claudication,  irregular heartbeat, leg swelling, near-syncope, orthopnea, palpitations, paroxysmal nocturnal dyspnea and syncope.  Respiratory:  Negative for shortness of breath.   Hematologic/Lymphatic: Negative for bleeding problem.    Studies Reviewed:   EKG: EKG Interpretation Date/Time:  Monday November 15 2023 13:09:13 EST Ventricular Rate:  76 PR Interval:  138 QRS Duration:  136 QT Interval:  410 QTC Calculation: 461 R Axis:   -61  Text Interpretation: Normal sinus rhythm Right bundle branch block Left anterior fascicular block Bifascicular block Minimal voltage criteria for LVH, may be normal variant ( R in aVL ) When compared with ECG of 25-Feb-2023 12:06, T wave inversion no longer evident in Inferior leads Confirmed by Tessa Lerner [id number]) on 11/15/2023 1:13:58 PM  Echocardiogram: 04/06/2023:  Normal LV systolic function with visual EF 60-65%. Left ventricle cavity is normal in size. Normal left ventricular wall thickness. Normal global  wall motion. Normal diastolic filling pattern, normal LAP.  An atrial septal aneurysm without a patent foramen ovale is present.  No significant valvular heart disease.  No prior study for comparison.      Stress Testing: Exercise nuclear stress test 03/25/2023: Low risk.  See report for additional details  RADIOLOGY: NA  Risk Assessment/Calculations:   Click Here to Calculate/Change CHADS2VASc Score The patient's CHADS2-VASc score is 1, indicating a 0.6% annual risk of stroke.    Labs:       Latest Ref Rng & Units 02/25/2023   11:59 AM 09/11/2011   12:07 PM  CBC  WBC  4.0 - 10.5 K/uL 6.9    Hemoglobin 13.0 - 17.0 g/dL 16.1  09.6   Hematocrit 39.0 - 52.0 % 46.4    Platelets 150 - 400 K/uL 275         Latest Ref Rng & Units 02/25/2023   11:59 AM  BMP  Glucose 70 - 99 mg/dL 86   BUN 8 - 23 mg/dL 15   Creatinine 0.45 - 1.24 mg/dL 4.09   Sodium 811 - 914 mmol/L 136   Potassium 3.5 - 5.1 mmol/L 3.9   Chloride 98 - 111 mmol/L 104   CO2 22  - 32 mmol/L 25   Calcium 8.9 - 10.3 mg/dL 8.9       Latest Ref Rng & Units 02/25/2023   11:59 AM  CMP  Glucose 70 - 99 mg/dL 86   BUN 8 - 23 mg/dL 15   Creatinine 7.82 - 1.24 mg/dL 9.56   Sodium 213 - 086 mmol/L 136   Potassium 3.5 - 5.1 mmol/L 3.9   Chloride 98 - 111 mmol/L 104   CO2 22 - 32 mmol/L 25   Calcium 8.9 - 10.3 mg/dL 8.9     No results found for: "CHOL", "HDL", "LDLCALC", "LDLDIRECT", "TRIG", "CHOLHDL" No results for input(s): "LIPOA" in the last 8760 hours. No components found for: "NTPROBNP" No results for input(s): "PROBNP" in the last 8760 hours. No results for input(s): "TSH" in the last 8760 hours.  Physical Exam:    Today's Vitals   11/15/23 1306  BP: 138/80  Pulse: 78  Resp: 16  SpO2: 98%  Weight: 170 lb 12.8 oz (77.5 kg)  Height: 5\' 10"  (1.778 m)   Body mass index is 24.51 kg/m. Wt Readings from Last 3 Encounters:  11/15/23 170 lb 12.8 oz (77.5 kg)  04/26/23 170 lb (77.1 kg)  03/15/23 171 lb 12.8 oz (77.9 kg)    Physical Exam  Constitutional: No distress.  Age appropriate, hemodynamically stable.   HENT:  Hearing aids   Neck: No JVD present.  Cardiovascular: Normal rate, regular rhythm, S1 normal, S2 normal, intact distal pulses and normal pulses. Exam reveals no gallop, no S3 and no S4.  No murmur heard. Pulses:      Dorsalis pedis pulses are 2+ on the right side and 2+ on the left side.       Posterior tibial pulses are 2+ on the right side and 2+ on the left side.  Pulmonary/Chest: Effort normal and breath sounds normal. No stridor. He has no wheezes. He has no rales.  Abdominal: Soft. Bowel sounds are normal. He exhibits no distension. There is no abdominal tenderness.  Musculoskeletal:        General: No edema.     Cervical back: Neck supple.  Skin: Skin is warm and moist.     Impression & Recommendation(s):  Impression:   ICD-10-CM   1. Paroxysmal atrial fibrillation (HCC)  I48.0 EKG 12-Lead    2. RBBB  I45.10     3. Sleep  apnea, unspecified type  G47.30        Recommendation(s):  Paroxysmal atrial fibrillation (HCC) Rate control: N/A Rhythm control: N/A Thromboembolic prophylaxis: Reemphasized 81 mg aspirin p.o. daily Diagnosed with A-fib back in 2013/2014.  Has seen Dr. Ladona Ridgel in the past. Triggers are usually dehydration or excessive caffeine intake.  Patient endorses that he appreciates when he goes in and out of A-fib and these episodes are very quick.  No underlying EKG or strips during these events to  justify it being A-fib. I have asked him to consider smart watch technology which is FDA approved for A-fib monitoring or consider a Kardia mobile as an alternative as well.  Patient does not want to be on maintenance dose of AV nodal blocking agents or as needed basis. However, he is aware to call us back if the intensity, frequency, and or duration changes.  RBBB Chronic and stable  Orders Placed:  Orders Placed This Encounter  Procedures   EKG 12-Lead   Final Medication List:    Meds ordered this encounter  Medications   aspirin EC 81 MG tablet    Sig: Take 1 tablet (81 mg total) by mouth daily. Swallow whole.    There are no discontinued medications.   Current Outpatient Medications:    5-Hydroxytryptophan (5-HTP) 50 MG TABS, Take 50 mg by mouth daily., Disp: , Rfl:    acetaminophen (TYLENOL) 650 MG CR tablet, Take 650 mg by mouth every 8 (eight) hours as needed for pain., Disp: , Rfl:    Acetylcysteine (NAC 600) 600 MG CAPS, Take 600 mg by mouth daily., Disp: , Rfl:    aspirin EC 81 MG tablet, Take 1 tablet (81 mg total) by mouth daily. Swallow whole., Disp: , Rfl:    ASTAXANTHIN PO, Take 10 mg by mouth daily., Disp: , Rfl:    Cholecalciferol (VITAMIN D3 MAXIMUM STRENGTH) 125 MCG (5000 UT) capsule, Take 10,000 Units by mouth daily., Disp: , Rfl:    CRANBERRY PO, Take 10,000 mg by mouth daily., Disp: , Rfl:    JATENZO 158 MG CAPS, Take 1 capsule by mouth 2 (two) times daily., Disp: ,  Rfl:    L-ARGININE PO, Take 500 mg by mouth daily., Disp: , Rfl:    latanoprost (XALATAN) 0.005 % ophthalmic solution, Place 1 drop into both eyes at bedtime., Disp: , Rfl:    MAGNESIUM MALATE PO, Take 142 mg by mouth daily., Disp: , Rfl:    moxifloxacin (VIGAMOX) 0.5 % ophthalmic solution, Place 1 drop into the right eye 3 (three) times daily., Disp: , Rfl:    OVER THE COUNTER MEDICATION, Take 2 capsules by mouth daily. Infla-650, Disp: , Rfl:    OVER THE COUNTER MEDICATION, Take 1 Scoop by mouth daily. Cardio For Life, Disp: , Rfl:    OVER THE COUNTER MEDICATION, Take 7 drops by mouth daily. Iosol liquid supplement, Disp: , Rfl:    OVER THE COUNTER MEDICATION, Take 1 capsule by mouth daily. Adrenal complex supplement, Disp: , Rfl:    OVER THE COUNTER MEDICATION, Take 3 capsules by mouth daily. Cyruta plus supplement, Disp: , Rfl:    OVER THE COUNTER MEDICATION, Take 3 capsules by mouth daily. A-F betafood supplement, Disp: , Rfl:    Probiotic Product (PROBIOTIC PO), Take 2 capsules by mouth daily., Disp: , Rfl:    sodium chloride (OCEAN) 0.65 % SOLN nasal spray, Place 1 spray into both nostrils as needed for congestion., Disp: , Rfl:    VITAMIN K PO, Take 1 capsule by mouth daily., Disp: , Rfl:    zinc gluconate 50 MG tablet, Take 50 mg by mouth daily., Disp: , Rfl:    sildenafil (REVATIO) 20 MG tablet, Take 40 mg by mouth as needed. (Patient not taking: Reported on 11/15/2023), Disp: , Rfl:    Testosterone 20.25 MG/ACT (1.62%) GEL, Apply 5 Pump topically daily. (Patient not taking: Reported on 11/15/2023), Disp: , Rfl:   Consent:   NA  Disposition:   1 year follow-up  sooner if needed.  His questions and concerns were addressed to his satisfaction. He voices understanding of the recommendations provided during this encounter.    Signed, Tessa Lerner, DO, [facility]  [facility]  [address] [place], [place] [address] 11/15/2023 2:13 PM

## 2023-11-15 NOTE — Patient Instructions (Signed)
 Medication Instructions:  Your physician has recommended you make the following change in your medication:   START Aspirin 81 mg once daily     *If you need a refill on your cardiac medications before your next appointment, please call your pharmacy*  Lab Work: None ordered today. If you have labs (blood work) drawn today and your tests are completely normal, you will receive your results only by: MyChart Message (if you have MyChart) OR A paper copy in the mail If you have any lab test that is abnormal or we need to change your treatment, we will call you to review the results.  Testing/Procedures: None ordered today.  Follow-Up: At [facility], you and your health needs are our priority.  As part of our continuing mission to provide you with exceptional heart care, we have created designated Provider Care Teams.  These Care Teams include your primary Cardiologist (physician) and Advanced Practice Providers (APPs -  Physician Assistants and Nurse Practitioners) who all work together to provide you with the care you need, when you need it.  We recommend signing up for the patient portal called "MyChart".  Sign up information is provided on this After Visit Summary.  MyChart is used to connect with patients for Virtual Visits (Telemedicine).  Patients are able to view lab/test results, encounter notes, upcoming appointments, etc.  Non-urgent messages can be sent to your provider as well.   To learn more about what you can do with MyChart, go to [email or web address].    Your next appointment:   1 year(s)  The format for your next appointment:   In Person  Provider:   Tessa Lerner, DO {

## 2023-11-17 DIAGNOSIS — S0501XA Injury of conjunctiva and corneal abrasion without foreign body, right eye, initial encounter: Secondary | ICD-10-CM | POA: Diagnosis not present

## 2023-11-22 DIAGNOSIS — H401131 Primary open-angle glaucoma, bilateral, mild stage: Secondary | ICD-10-CM | POA: Diagnosis not present

## 2023-12-22 DIAGNOSIS — H401131 Primary open-angle glaucoma, bilateral, mild stage: Secondary | ICD-10-CM | POA: Diagnosis not present

## 2023-12-28 DIAGNOSIS — E291 Testicular hypofunction: Secondary | ICD-10-CM | POA: Diagnosis not present

## 2023-12-28 DIAGNOSIS — N529 Male erectile dysfunction, unspecified: Secondary | ICD-10-CM | POA: Diagnosis not present

## 2024-01-18 DIAGNOSIS — H401134 Primary open-angle glaucoma, bilateral, indeterminate stage: Secondary | ICD-10-CM | POA: Diagnosis not present

## 2024-01-21 DIAGNOSIS — E291 Testicular hypofunction: Secondary | ICD-10-CM | POA: Diagnosis not present

## 2024-01-31 DIAGNOSIS — E291 Testicular hypofunction: Secondary | ICD-10-CM | POA: Diagnosis not present

## 2024-01-31 DIAGNOSIS — D751 Secondary polycythemia: Secondary | ICD-10-CM | POA: Diagnosis not present

## 2024-03-02 DIAGNOSIS — S30860A Insect bite (nonvenomous) of lower back and pelvis, initial encounter: Secondary | ICD-10-CM | POA: Diagnosis not present

## 2024-03-02 DIAGNOSIS — L03312 Cellulitis of back [any part except buttock]: Secondary | ICD-10-CM | POA: Diagnosis not present

## 2024-03-23 DIAGNOSIS — H401131 Primary open-angle glaucoma, bilateral, mild stage: Secondary | ICD-10-CM | POA: Diagnosis not present

## 2024-04-03 DIAGNOSIS — L570 Actinic keratosis: Secondary | ICD-10-CM | POA: Diagnosis not present

## 2024-04-03 DIAGNOSIS — L814 Other melanin hyperpigmentation: Secondary | ICD-10-CM | POA: Diagnosis not present

## 2024-04-03 DIAGNOSIS — L821 Other seborrheic keratosis: Secondary | ICD-10-CM | POA: Diagnosis not present

## 2024-04-03 DIAGNOSIS — D235 Other benign neoplasm of skin of trunk: Secondary | ICD-10-CM | POA: Diagnosis not present

## 2024-04-18 DIAGNOSIS — E291 Testicular hypofunction: Secondary | ICD-10-CM | POA: Diagnosis not present

## 2024-04-18 DIAGNOSIS — D751 Secondary polycythemia: Secondary | ICD-10-CM | POA: Diagnosis not present

## 2024-04-19 DIAGNOSIS — H401131 Primary open-angle glaucoma, bilateral, mild stage: Secondary | ICD-10-CM | POA: Diagnosis not present

## 2024-05-02 DIAGNOSIS — E291 Testicular hypofunction: Secondary | ICD-10-CM | POA: Diagnosis not present

## 2024-05-02 DIAGNOSIS — D751 Secondary polycythemia: Secondary | ICD-10-CM | POA: Diagnosis not present

## 2024-05-02 DIAGNOSIS — Z125 Encounter for screening for malignant neoplasm of prostate: Secondary | ICD-10-CM | POA: Diagnosis not present

## 2024-05-02 DIAGNOSIS — R319 Hematuria, unspecified: Secondary | ICD-10-CM | POA: Diagnosis not present

## 2024-05-02 DIAGNOSIS — Z1329 Encounter for screening for other suspected endocrine disorder: Secondary | ICD-10-CM | POA: Diagnosis not present

## 2024-05-02 DIAGNOSIS — R600 Localized edema: Secondary | ICD-10-CM | POA: Diagnosis not present

## 2024-05-02 DIAGNOSIS — Z0000 Encounter for general adult medical examination without abnormal findings: Secondary | ICD-10-CM | POA: Diagnosis not present

## 2024-05-02 DIAGNOSIS — Z13220 Encounter for screening for lipoid disorders: Secondary | ICD-10-CM | POA: Diagnosis not present

## 2024-05-02 DIAGNOSIS — Z0001 Encounter for general adult medical examination with abnormal findings: Secondary | ICD-10-CM | POA: Diagnosis not present

## 2024-06-13 DIAGNOSIS — M7121 Synovial cyst of popliteal space [Baker], right knee: Secondary | ICD-10-CM | POA: Diagnosis not present

## 2024-06-13 DIAGNOSIS — N521 Erectile dysfunction due to diseases classified elsewhere: Secondary | ICD-10-CM | POA: Diagnosis not present

## 2024-06-19 DIAGNOSIS — R600 Localized edema: Secondary | ICD-10-CM | POA: Diagnosis not present

## 2024-06-19 DIAGNOSIS — M7121 Synovial cyst of popliteal space [Baker], right knee: Secondary | ICD-10-CM | POA: Diagnosis not present

## 2024-07-06 DIAGNOSIS — S83231A Complex tear of medial meniscus, current injury, right knee, initial encounter: Secondary | ICD-10-CM | POA: Diagnosis not present

## 2024-07-06 DIAGNOSIS — M1711 Unilateral primary osteoarthritis, right knee: Secondary | ICD-10-CM | POA: Diagnosis not present

## 2024-07-12 ENCOUNTER — Encounter: Payer: Self-pay | Admitting: Physical Therapy

## 2024-07-12 ENCOUNTER — Ambulatory Visit: Attending: Orthopaedic Surgery | Admitting: Physical Therapy

## 2024-07-12 ENCOUNTER — Other Ambulatory Visit: Payer: Self-pay

## 2024-07-12 DIAGNOSIS — M6281 Muscle weakness (generalized): Secondary | ICD-10-CM | POA: Diagnosis not present

## 2024-07-12 DIAGNOSIS — M25661 Stiffness of right knee, not elsewhere classified: Secondary | ICD-10-CM | POA: Insufficient documentation

## 2024-07-12 DIAGNOSIS — M25561 Pain in right knee: Secondary | ICD-10-CM | POA: Diagnosis not present

## 2024-07-12 DIAGNOSIS — R262 Difficulty in walking, not elsewhere classified: Secondary | ICD-10-CM | POA: Diagnosis not present

## 2024-07-12 NOTE — Therapy (Signed)
 OUTPATIENT PHYSICAL THERAPY LOWER EXTREMITY EVALUATION   Patient Name: George Gardner MRN: [id number] DOB:[birth year], 73 y.o., male Today's Date: 07/12/2024  END OF SESSION:  PT End of Session - 07/12/24 1744     Visit Number 1    Date for Recertification  10/12/24    Authorization Type HTA    PT Start Time 1744    PT Stop Time 1830    PT Time Calculation (min) 46 min    Activity Tolerance Patient tolerated treatment well    Behavior During Therapy WFL for tasks assessed/performed          Past Medical History:  Diagnosis Date   Atrial fibrillation (HCC)    Attention deficit disorder    BPH (benign prostatic hyperplasia)    Carpal tunnel syndrome    Complication of anesthesia    shaking uncontrollably, very anxious, pain   Dysrhythmia    never identified with halter monitor   Epididymitis    H/O eye surgery    Hearing loss    History of torn meniscus of left knee    Hypercholesteremia    Impingement syndrome, shoulder, right    OSA (obstructive sleep apnea)    mild-didn't tolerate cpap   Palpitations    Pneumonia    Sleep apnea    Tinnitus    Vitreous detachment of right eye    Past Surgical History:  Procedure Laterality Date   CARPAL TUNNEL RELEASE  09/11/2011   Procedure: CARPAL TUNNEL RELEASE;  Surgeon: Tanda DELENA Heading;  Location: [facility];  Service: Orthopedics;  Laterality: Right;   COLONOSCOPY  10/16/2011   EYE SURGERY  08/15/2011   rt cataract removal w iol implant   FOOT SURGERY  09/14/1969   HERNIA REPAIR Left 1998   again in 2000   TONSILLECTOMY  09/15/1959   yag laser surgery     Patient Active Problem List   Diagnosis Date Noted   Atrial fibrillation (HCC) 06/14/2012   Syncope 06/14/2012   Right carpal tunnel syndrome 09/11/2011    PCP: Rosina Bullock, PA  REFERRING PROVIDER: Cristy, MD  REFERRING DIAG: right medial meniscus tear  THERAPY DIAG:  Acute pain of right knee  Stiffness of right knee, not  elsewhere classified  Difficulty walking  Muscle weakness (generalized)  Rationale for Evaluation and Treatment: Rehabilitation  ONSET DATE: 07/06/24  SUBJECTIVE:   SUBJECTIVE STATEMENT: Patient reports that he started having right knee pain, he is unsure of a specific cause.  He was doing some running, he is very active and fit, he lifts weights, does a lot of yard work and gardening.  He had an MRI that showed a medial meniscus tear and some arthritis, not bone on bone.  Got injection on 07/06/24, reports that it has helped some  PERTINENT HISTORY: Afib, ADD, BPH, CTS PAIN:  Are you having pain? Yes: NPRS scale: 0/10 Pain location: left medial knee and posterior knee Pain description: ache, sharp Aggravating factors: squatting pain up to 3/10 Relieving factors: the shot has helped, ice and rest pain can be 0/10  PRECAUTIONS: None  RED FLAGS: None   WEIGHT BEARING RESTRICTIONS: No  FALLS:  Has patient fallen in last 6 months? No  LIVING ENVIRONMENT: Lives with: lives with their family Lives in: House/apartment Stairs: Yes: Internal: 10 steps; can reach both Has following equipment at home: None  OCCUPATION: retired  PLOF: Independent and works out daily, some running, a lot of [employer], a lot of yard work and gardening  PATIENT GOALS: avoid surgery  NEXT MD VISIT: December 9th  OBJECTIVE:  Note: Objective measures were completed at Evaluation unless otherwise noted.  DIAGNOSTIC FINDINGS: medial meniscus tear   COGNITION: Overall cognitive status: Within functional limits for tasks assessed     SENSATION: WFL  EDEMA:  Medial right knee edema and posterior edema  MUSCLE LENGTH: Mild HS and calf tightness  POSTURE: rounded shoulders and forward head  PALPATION: Medial right joint space is very tender   LOWER EXTREMITY ROM:  WNL's with    LOWER EXTREMITY MMT:4/5 some crepitus in the knee left knee crepitus is worse than the right    LOWER  EXTREMITY SPECIAL TESTS:  Has crepitus bilaterally, good tracking patella  FUNCTIONAL TESTS:  5 times sit to stand: 14 seconds Timed up and go (TUG): 11 seconds  GAIT: Distance walked: 100 feet Assistive device utilized: None Level of assistance: Complete Independence Comments: mild limp on the right with the first 5 steps                                                                                                                                TREATMENT DATE:  07/12/24 Evaluation    PATIENT EDUCATION:  Education details: POC/gym Person educated: Patient Education method: Programmer, Multimedia, Facilities Manager, and Verbal cues Education comprehension: verbalized understanding  HOME EXERCISE PROGRAM: Reviewed gym activity, mechanics  ASSESSMENT:  CLINICAL IMPRESSION: Patient is a 73 y.o. male who was seen today for physical therapy evaluation and treatment for right medial meniscus tear.  He does not have bone on bone, he does have some significant joint crepitus on the left and mild on the right, has food ROM, good strength, mild pain.   OBJECTIVE IMPAIRMENTS: Abnormal gait, cardiopulmonary status limiting activity, decreased activity tolerance, decreased balance, decreased endurance, decreased mobility, difficulty walking, decreased ROM, decreased strength, increased edema, impaired flexibility, improper body mechanics, and pain.   REHAB POTENTIAL: Good  CLINICAL DECISION MAKING: Stable/uncomplicated  EVALUATION COMPLEXITY: low   GOALS: Goals reviewed with patient? Yes  SHORT TERM GOALS: Target date: 08/03/24 Independent with advanced HEP Baseline: Goal status: INITIAL  LONG TERM GOALS: Target date: 10/12/24  Independent with advanced HEP, gym Baseline:  Goal status: INITIAL  2.  Understand body mechanics to decrease stress on the knees Baseline:  Goal status: INITIAL  3.  Report no pain at rest Baseline:  Goal status: INITIAL  4.  Strength 5/5 with out  pain Baseline:  Goal status: INITIAL  PLAN:  PT FREQUENCY: 1x/week  PT DURATION: 12 weeks  PLANNED INTERVENTIONS: 97164- PT Re-evaluation, 97110-Therapeutic exercises, 97530- Therapeutic activity, 97112- Neuromuscular re-education, 97535- Self Care, 02859- Manual therapy, [phone]- Gait training, [phone]- Electrical stimulation (unattended), 97016- Vasopneumatic device, 97035- Ultrasound, [id number]- Ionotophoresis 4mg /ml Dexamethasone , Patient/Family education, Balance training, Stair training, Taping, Joint mobilization, Cryotherapy, and Moist heat  PLAN FOR NEXT SESSION: assure safe gym, flexibility   OBADIAH OZELL ORN, PT 07/12/2024, 5:44 PM

## 2024-07-20 DIAGNOSIS — H401131 Primary open-angle glaucoma, bilateral, mild stage: Secondary | ICD-10-CM | POA: Diagnosis not present

## 2024-07-21 ENCOUNTER — Ambulatory Visit: Attending: Orthopaedic Surgery | Admitting: Physical Therapy

## 2024-07-21 ENCOUNTER — Encounter: Payer: Self-pay | Admitting: Physical Therapy

## 2024-07-21 DIAGNOSIS — M25561 Pain in right knee: Secondary | ICD-10-CM | POA: Diagnosis not present

## 2024-07-21 DIAGNOSIS — M25511 Pain in right shoulder: Secondary | ICD-10-CM | POA: Insufficient documentation

## 2024-07-21 DIAGNOSIS — M6281 Muscle weakness (generalized): Secondary | ICD-10-CM | POA: Diagnosis not present

## 2024-07-21 DIAGNOSIS — G8929 Other chronic pain: Secondary | ICD-10-CM | POA: Diagnosis not present

## 2024-07-21 DIAGNOSIS — M25661 Stiffness of right knee, not elsewhere classified: Secondary | ICD-10-CM | POA: Diagnosis not present

## 2024-07-21 DIAGNOSIS — R262 Difficulty in walking, not elsewhere classified: Secondary | ICD-10-CM | POA: Insufficient documentation

## 2024-07-21 NOTE — Therapy (Signed)
 OUTPATIENT PHYSICAL THERAPY LOWER EXTREMITY TREATMENT   Patient Name: George Gardner MRN: [id number] DOB:December 23, 1950, 73 y.o., male Today's Date: 07/21/2024  END OF SESSION:  PT End of Session - 07/21/24 1012     Visit Number 2    Date for Recertification  10/12/24    Authorization Type HTA    PT Start Time 1011    PT Stop Time 1100    PT Time Calculation (min) 49 min    Activity Tolerance Patient tolerated treatment well    Behavior During Therapy WFL for tasks assessed/performed          Past Medical History:  Diagnosis Date   Atrial fibrillation (HCC)    Attention deficit disorder    BPH (benign prostatic hyperplasia)    Carpal tunnel syndrome    Complication of anesthesia    shaking uncontrollably, very anxious, pain   Dysrhythmia    never identified with halter monitor   Epididymitis    H/O eye surgery    Hearing loss    History of torn meniscus of left knee    Hypercholesteremia    Impingement syndrome, shoulder, right    OSA (obstructive sleep apnea)    mild-didn't tolerate cpap   Palpitations    Pneumonia    Sleep apnea    Tinnitus    Vitreous detachment of right eye    Past Surgical History:  Procedure Laterality Date   CARPAL TUNNEL RELEASE  09/11/2011   Procedure: CARPAL TUNNEL RELEASE;  Surgeon: Tanda DELENA Heading;  Location: [facility];  Service: Orthopedics;  Laterality: Right;   COLONOSCOPY  10/16/2011   EYE SURGERY  08/15/2011   rt cataract removal w iol implant   FOOT SURGERY  09/14/1969   HERNIA REPAIR Left 1998   again in 2000   TONSILLECTOMY  09/15/1959   yag laser surgery     Patient Active Problem List   Diagnosis Date Noted   Atrial fibrillation (HCC) 06/14/2012   Syncope 06/14/2012   Right carpal tunnel syndrome 09/11/2011    PCP: Rosina Bullock, PA  REFERRING PROVIDER: Cristy, MD  REFERRING DIAG: right medial meniscus tear  THERAPY DIAG:  Acute pain of right knee  Stiffness of right knee, not elsewhere  classified  Difficulty walking  Muscle weakness (generalized)  Rationale for Evaluation and Treatment: Rehabilitation  ONSET DATE: 07/06/24  SUBJECTIVE:   SUBJECTIVE STATEMENT: Did not get to the gym.  Had some questions about what to do .  Patient reports that he started having right knee pain, he is unsure of a specific cause.  He was doing some running, he is very active and fit, he lifts weights, does a lot of yard work and gardening.  He had an MRI that showed a medial meniscus tear and some arthritis, not bone on bone.  Got injection on 07/06/24, reports that it has helped some  PERTINENT HISTORY: Afib, ADD, BPH, CTS PAIN:  Are you having pain? Yes: NPRS scale: 0/10 Pain location: left medial knee and posterior knee Pain description: ache, sharp Aggravating factors: squatting pain up to 3/10 Relieving factors: the shot has helped, ice and rest pain can be 0/10  PRECAUTIONS: None  RED FLAGS: None   WEIGHT BEARING RESTRICTIONS: No  FALLS:  Has patient fallen in last 6 months? No  LIVING ENVIRONMENT: Lives with: lives with their family Lives in: House/apartment Stairs: Yes: Internal: 10 steps; can reach both Has following equipment at home: None  OCCUPATION: retired  PLOF: Independent and  works out daily, some running, a lot of [employer], a lot of yard work and gardening  PATIENT GOALS: avoid surgery  NEXT MD VISIT: December 9th  OBJECTIVE:  Note: Objective measures were completed at Evaluation unless otherwise noted.  DIAGNOSTIC FINDINGS: medial meniscus tear   COGNITION: Overall cognitive status: Within functional limits for tasks assessed     SENSATION: WFL  EDEMA:  Medial right knee edema and posterior edema  MUSCLE LENGTH: Mild HS and calf tightness  POSTURE: rounded shoulders and forward head  PALPATION: Medial right joint space is very tender   LOWER EXTREMITY ROM:  WNL's with    LOWER EXTREMITY MMT:4/5 some crepitus in the knee left  knee crepitus is worse than the right    LOWER EXTREMITY SPECIAL TESTS:  Has crepitus bilaterally, good tracking patella  FUNCTIONAL TESTS:  5 times sit to stand: 14 seconds Timed up and go (TUG): 11 seconds  GAIT: Distance walked: 100 feet Assistive device utilized: None Level of assistance: Complete Independence Comments: mild limp on the right with the first 5 steps                                                                                                                                TREATMENT DATE:  07/21/24 Bike level 4 x 5 minutes Nustep level 5 x 5 minutes Slant board stretch HS curls 25# 2x10 Leg press small ROM 40# but left leg with bone on bone crepitus so stopped Feet on ball K2C, rotation, bridges, isometric abs Passive stretch all LE  07/12/24 Evaluation    PATIENT EDUCATION:  Education details: POC/gym Person educated: Patient Education method: Programmer, Multimedia, Facilities Manager, and Verbal cues Education comprehension: verbalized understanding  HOME EXERCISE PROGRAM: Reviewed gym activity, mechanics  ASSESSMENT:  CLINICAL IMPRESSION: Patient is doing well, a little stiff and sore, had to back off the Leg press due to bone on bone crepitus in the left knee.  May try resisted walking next visit.  Patient is a 73 y.o. male who was seen today for physical therapy evaluation and treatment for right medial meniscus tear.  He does not have bone on bone, he does have some significant joint crepitus on the left and mild on the right, has food ROM, good strength, mild pain.   OBJECTIVE IMPAIRMENTS: Abnormal gait, cardiopulmonary status limiting activity, decreased activity tolerance, decreased balance, decreased endurance, decreased mobility, difficulty walking, decreased ROM, decreased strength, increased edema, impaired flexibility, improper body mechanics, and pain.   REHAB POTENTIAL: Good  CLINICAL DECISION MAKING: Stable/uncomplicated  EVALUATION  COMPLEXITY: low   GOALS: Goals reviewed with patient? Yes  SHORT TERM GOALS: Target date: 08/03/24 Independent with advanced HEP Baseline: Goal status: met 07/21/24  LONG TERM GOALS: Target date: 10/12/24  Independent with advanced HEP, gym Baseline:  Goal status: INITIAL  2.  Understand body mechanics to decrease stress on the knees Baseline:  Goal status: INITIAL  3.  Report no pain at  rest Baseline:  Goal status: INITIAL  4.  Strength 5/5 with out pain Baseline:  Goal status: INITIAL  PLAN:  PT FREQUENCY: 1x/week  PT DURATION: 12 weeks  PLANNED INTERVENTIONS: 97164- PT Re-evaluation, 97110-Therapeutic exercises, 97530- Therapeutic activity, [id number]- Neuromuscular re-education, 97535- Self Care, 02859- Manual therapy, [phone]- Gait training, [phone]- Electrical stimulation (unattended), 97016- Vasopneumatic device, 97035- Ultrasound, 02966- Ionotophoresis 4mg /ml Dexamethasone , Patient/Family education, Balance training, Stair training, Taping, Joint mobilization, Cryotherapy, and Moist heat  PLAN FOR NEXT SESSION: assure safe gym, flexibility   Damoney Julia W, PT 07/21/2024, 10:14 AM

## 2024-07-31 ENCOUNTER — Ambulatory Visit

## 2024-07-31 DIAGNOSIS — N521 Erectile dysfunction due to diseases classified elsewhere: Secondary | ICD-10-CM | POA: Diagnosis not present

## 2024-07-31 DIAGNOSIS — E291 Testicular hypofunction: Secondary | ICD-10-CM | POA: Diagnosis not present

## 2024-08-02 ENCOUNTER — Other Ambulatory Visit: Payer: Self-pay

## 2024-08-02 ENCOUNTER — Ambulatory Visit

## 2024-08-02 DIAGNOSIS — M6281 Muscle weakness (generalized): Secondary | ICD-10-CM

## 2024-08-02 DIAGNOSIS — M25561 Pain in right knee: Secondary | ICD-10-CM

## 2024-08-02 DIAGNOSIS — R262 Difficulty in walking, not elsewhere classified: Secondary | ICD-10-CM

## 2024-08-02 DIAGNOSIS — M25661 Stiffness of right knee, not elsewhere classified: Secondary | ICD-10-CM

## 2024-08-02 DIAGNOSIS — G8929 Other chronic pain: Secondary | ICD-10-CM

## 2024-08-02 NOTE — Therapy (Signed)
 OUTPATIENT PHYSICAL THERAPY LOWER EXTREMITY TREATMENT   Patient Name: George Gardner MRN: [id number] DOB:03-16-1951, 73 y.o., male Today's Date: 08/02/2024  END OF SESSION:  PT End of Session - 08/02/24 1432     Visit Number 3    Date for Recertification  10/12/24    Authorization Type HTA    Progress Note Due on Visit 10    PT Start Time 1430    PT Stop Time 1513    PT Time Calculation (min) 43 min    Activity Tolerance Patient tolerated treatment well    Behavior During Therapy WFL for tasks assessed/performed           Past Medical History:  Diagnosis Date   Atrial fibrillation (HCC)    Attention deficit disorder    BPH (benign prostatic hyperplasia)    Carpal tunnel syndrome    Complication of anesthesia    shaking uncontrollably, very anxious, pain   Dysrhythmia    never identified with halter monitor   Epididymitis    H/O eye surgery    Hearing loss    History of torn meniscus of left knee    Hypercholesteremia    Impingement syndrome, shoulder, right    OSA (obstructive sleep apnea)    mild-didn't tolerate cpap   Palpitations    Pneumonia    Sleep apnea    Tinnitus    Vitreous detachment of right eye    Past Surgical History:  Procedure Laterality Date   CARPAL TUNNEL RELEASE  09/11/2011   Procedure: CARPAL TUNNEL RELEASE;  Surgeon: Tanda DELENA Heading;  Location: [facility];  Service: Orthopedics;  Laterality: Right;   COLONOSCOPY  10/16/2011   EYE SURGERY  08/15/2011   rt cataract removal w iol implant   FOOT SURGERY  09/14/1969   HERNIA REPAIR Left 1998   again in 2000   TONSILLECTOMY  09/15/1959   yag laser surgery     Patient Active Problem List   Diagnosis Date Noted   Atrial fibrillation (HCC) 06/14/2012   Syncope 06/14/2012   Right carpal tunnel syndrome 09/11/2011    PCP: Rosina Bullock, PA  REFERRING PROVIDER: Cristy, MD  REFERRING DIAG: right medial meniscus tear  THERAPY DIAG:  Acute pain of right  knee  Stiffness of right knee, not elsewhere classified  Difficulty walking  Muscle weakness (generalized)  Chronic right shoulder pain  Rationale for Evaluation and Treatment: Rehabilitation  ONSET DATE: 07/06/24  SUBJECTIVE:   SUBJECTIVE STATEMENT: Has gotten a physioball , has been performing the stretches , has not been to gym .  Patient reports that he started having right knee pain, he is unsure of a specific cause.  He was doing some running, he is very active and fit, he lifts weights, does a lot of yard work and gardening.  He had an MRI that showed a medial meniscus tear and some arthritis, not bone on bone.  Got injection on 07/06/24, reports that it has helped some  PERTINENT HISTORY: Afib, ADD, BPH, CTS PAIN:  Are you having pain? Yes: NPRS scale: 0/10 Pain location: left medial knee and posterior knee Pain description: ache, sharp Aggravating factors: squatting pain up to 3/10 Relieving factors: the shot has helped, ice and rest pain can be 0/10  PRECAUTIONS: None  RED FLAGS: None   WEIGHT BEARING RESTRICTIONS: No  FALLS:  Has patient fallen in last 6 months? No  LIVING ENVIRONMENT: Lives with: lives with their family Lives in: House/apartment Stairs: Yes: Internal: 10  steps; can reach both Has following equipment at home: None  OCCUPATION: retired  PLOF: Independent and works out daily, some running, a lot of [employer], a lot of yard work and gardening  PATIENT GOALS: avoid surgery  NEXT MD VISIT: December 9th  OBJECTIVE:  Note: Objective measures were completed at Evaluation unless otherwise noted.  DIAGNOSTIC FINDINGS: medial meniscus tear   COGNITION: Overall cognitive status: Within functional limits for tasks assessed     SENSATION: WFL  EDEMA:  Medial right knee edema and posterior edema  MUSCLE LENGTH: Mild HS and calf tightness  POSTURE: rounded shoulders and forward head  PALPATION: Medial right joint space is very tender    LOWER EXTREMITY ROM:  WNL's with    LOWER EXTREMITY MMT:4/5 some crepitus in the knee left knee crepitus is worse than the right    LOWER EXTREMITY SPECIAL TESTS:  Has crepitus bilaterally, good tracking patella  FUNCTIONAL TESTS:  5 times sit to stand: 14 seconds Timed up and go (TUG): 11 seconds  GAIT: Distance walked: 100 feet Assistive device utilized: None Level of assistance: Complete Independence Comments: mild limp on the right with the first 5 steps                                                                                                                                TREATMENT DATE:  08/02/24: Recumbent bike L 4 x 5 min  Supine for manual distraction of R lower leg at ankle, combined with light quad sets, 5 sec holds, 10 reps R hamstring stretch, therapist providing manual stretch R post knee capsule Supine with 65 cm ball , legs over ball for LTR, B knee to chest, bridging, hands on table, then hands on chest then hands behind head SLR with R LE Er 30 degrees supine and seated Leg press 40# 60 degrees to 0 degrees weight on heels education in light resistance and positioning with R knee flexion maintaining less than 90  Standing B plantarflexor stretch , with heel lifts  Seated B knee flexion, B LE concentric, eccentric raising with R  Sidelying R hip clam shells with green t band x 20 reps  07/21/24 Bike level 4 x 5 minutes Nustep level 5 x 5 minutes Slant board stretch HS curls 25# 2x10 Leg press small ROM 40# but left leg with bone on bone crepitus so stopped Feet on ball K2C, rotation, bridges, isometric abs Passive stretch all LE  07/12/24 Evaluation    PATIENT EDUCATION:  Education details: POC/gym Person educated: Patient Education method: Programmer, Multimedia, Facilities Manager, and Verbal cues Education comprehension: verbalized understanding  HOME EXERCISE PROGRAM: Reviewed gym activity, mechanics  ASSESSMENT:  CLINICAL IMPRESSION: Patient is  doing well, liked the stretches in particular that isolated R post knee capsule.  Education throughout on activity modification and specific strengthening, stretching to maintain long term function B knees  Continues to benefit from ongoing PT to transition back to safe gym  activities.  Patient is a 73 y.o. male who was seen today for physical therapy evaluation and treatment for right medial meniscus tear.  He does not have bone on bone, he does have some significant joint crepitus on the left and mild on the right, has food ROM, good strength, mild pain.   OBJECTIVE IMPAIRMENTS: Abnormal gait, cardiopulmonary status limiting activity, decreased activity tolerance, decreased balance, decreased endurance, decreased mobility, difficulty walking, decreased ROM, decreased strength, increased edema, impaired flexibility, improper body mechanics, and pain.   REHAB POTENTIAL: Good  CLINICAL DECISION MAKING: Stable/uncomplicated  EVALUATION COMPLEXITY: low   GOALS: Goals reviewed with patient? Yes  SHORT TERM GOALS: Target date: 08/03/24 Independent with advanced HEP Baseline: Goal status: met 07/21/24  LONG TERM GOALS: Target date: 10/12/24  Independent with advanced HEP, gym Baseline:  Goal status: INITIAL  2.  Understand body mechanics to decrease stress on the knees Baseline:  Goal status: INITIAL  3.  Report no pain at rest Baseline:  Goal status: INITIAL  4.  Strength 5/5 with out pain Baseline:  Goal status: INITIAL  PLAN:  PT FREQUENCY: 1x/week  PT DURATION: 12 weeks  PLANNED INTERVENTIONS: 97164- PT Re-evaluation, 97110-Therapeutic exercises, 97530- Therapeutic activity, [id number]- Neuromuscular re-education, 97535- Self Care, 02859- Manual therapy, [id number]- Gait training, [phone]- Electrical stimulation (unattended), 97016- Vasopneumatic device, [id number]- Ultrasound, 02966- Ionotophoresis 4mg /ml Dexamethasone , Patient/Family education, Balance training, Stair training, Taping,  Joint mobilization, Cryotherapy, and Moist heat  PLAN FOR NEXT SESSION: assure safe gym, flexibility   Randle Shatzer L Tynslee Bowlds, PT, DPT, OCS 08/02/2024, 5:04 PM

## 2024-08-07 ENCOUNTER — Other Ambulatory Visit: Payer: Self-pay

## 2024-08-07 ENCOUNTER — Ambulatory Visit

## 2024-08-07 DIAGNOSIS — R262 Difficulty in walking, not elsewhere classified: Secondary | ICD-10-CM

## 2024-08-07 DIAGNOSIS — M6281 Muscle weakness (generalized): Secondary | ICD-10-CM

## 2024-08-07 DIAGNOSIS — M25561 Pain in right knee: Secondary | ICD-10-CM

## 2024-08-07 DIAGNOSIS — M25661 Stiffness of right knee, not elsewhere classified: Secondary | ICD-10-CM

## 2024-08-07 NOTE — Therapy (Signed)
 OUTPATIENT PHYSICAL THERAPY LOWER EXTREMITY TREATMENT   Patient Name: [name] MRN: [id number] DOB:March 16, 1951, 73 y.o., male Today's Date: 08/07/2024  END OF SESSION:  PT End of Session - 08/07/24 1519     Visit Number 4    Date for Recertification  10/12/24    Authorization Type HTA    Progress Note Due on Visit 10    PT Start Time 1515    PT Stop Time 1600    PT Time Calculation (min) 45 min    Activity Tolerance Patient tolerated treatment well    Behavior During Therapy WFL for tasks assessed/performed            Past Medical History:  Diagnosis Date   Atrial fibrillation (HCC)    Attention deficit disorder    BPH (benign prostatic hyperplasia)    Carpal tunnel syndrome    Complication of anesthesia    shaking uncontrollably, very anxious, pain   Dysrhythmia    never identified with halter monitor   Epididymitis    H/O eye surgery    Hearing loss    History of torn meniscus of left knee    Hypercholesteremia    Impingement syndrome, shoulder, right    OSA (obstructive sleep apnea)    mild-didn't tolerate cpap   Palpitations    Pneumonia    Sleep apnea    Tinnitus    Vitreous detachment of right eye    Past Surgical History:  Procedure Laterality Date   CARPAL TUNNEL RELEASE  09/11/2011   Procedure: CARPAL TUNNEL RELEASE;  Surgeon: Tanda DELENA Heading;  Location: [facility];  Service: Orthopedics;  Laterality: Right;   COLONOSCOPY  10/16/2011   EYE SURGERY  08/15/2011   rt cataract removal w iol implant   FOOT SURGERY  09/14/1969   HERNIA REPAIR Left 1998   again in 2000   TONSILLECTOMY  09/15/1959   yag laser surgery     Patient Active Problem List   Diagnosis Date Noted   Atrial fibrillation (HCC) 06/14/2012   Syncope 06/14/2012   Right carpal tunnel syndrome 09/11/2011    PCP: George Bullock, PA  REFERRING PROVIDER: Cristy, MD  REFERRING DIAG: right medial meniscus tear  THERAPY DIAG:  Acute pain of right  knee  Stiffness of right knee, not elsewhere classified  Difficulty walking  Muscle weakness (generalized)  Rationale for Evaluation and Treatment: Rehabilitation  ONSET DATE: 07/06/24  SUBJECTIVE:   SUBJECTIVE STATEMENT: Has been cooking all day, and needs to mow to clean up th leaves , has to be careful to avoid twisting R knee when getting off of riding mower.has not been to gym .  Patient reports that he started having right knee pain, he is unsure of a specific cause.  He was doing some running, he is very active and fit, he lifts weights, does a lot of yard work and gardening.  He had an MRI that showed a medial meniscus tear and some arthritis, not bone on bone.  Got injection on 07/06/24, reports that it has helped some  PERTINENT HISTORY: Afib, ADD, BPH, CTS PAIN:  Are George having pain? Yes: NPRS scale: 0/10 Pain location: left medial knee and posterior knee Pain description: ache, sharp Aggravating factors: squatting pain up to 3/10 Relieving factors: the shot has helped, ice and rest pain can be 0/10  PRECAUTIONS: None  RED FLAGS: None   WEIGHT BEARING RESTRICTIONS: No  FALLS:  Has patient fallen in last 6 months? No  LIVING  ENVIRONMENT: Lives with: lives with their family Lives in: House/apartment Stairs: Yes: Internal: 10 steps; can reach both Has following equipment at home: None  OCCUPATION: retired  PLOF: Independent and works out daily, some running, a lot of [employer], a lot of yard work and gardening  PATIENT GOALS: avoid surgery  NEXT MD VISIT: December 9th  OBJECTIVE:  Note: Objective measures were completed at Evaluation unless otherwise noted.  DIAGNOSTIC FINDINGS: medial meniscus tear   COGNITION: Overall cognitive status: Within functional limits for tasks assessed     SENSATION: WFL  EDEMA:  Medial right knee edema and posterior edema  MUSCLE LENGTH: Mild HS and calf tightness  POSTURE: rounded shoulders and forward  head  PALPATION: Medial right joint space is very tender   LOWER EXTREMITY ROM:  WNL's with    LOWER EXTREMITY MMT:4/5 some crepitus in the knee left knee crepitus is worse than the right    LOWER EXTREMITY SPECIAL TESTS:  Has crepitus bilaterally, good tracking patella  FUNCTIONAL TESTS:  5 times sit to stand: 14 seconds Timed up and go (TUG): 11 seconds  GAIT: Distance walked: 100 feet Assistive device utilized: None Level of assistance: Complete Independence Comments: mild limp on the right with the first 5 steps                                                                                                                                TREATMENT DATE:  08/07/24:  Recumbent cycle: L 4 x 5 min  Supine for  manual distraction of R lower leg at ankle, combined with light quad sets, 5 sec holds, 10 reps Supine R hamstring stretch, pt using Yoga strap , therapist providing overpressure R post knee capsule B heel drops, forefeet on 3 bar 15 -20 sec stretch, B heel raises Leg press B 60#  Supine with 65 cm ball , legs over ball for LTR, B knee to chest, bridging, hands on table, then hands on chest then hands behind head Hamstring curls 35#, 3 x 15  eccentric raises with R LE Theragun massage R medial hamstring tendon   08/02/24: Recumbent bike L 4 x 5 min  Supine for manual distraction of R lower leg at ankle, combined with light quad sets, 5 sec holds, 10 reps R hamstring stretch, therapist providing manual stretch R post knee capsule Supine with 65 cm ball , legs over ball for LTR, B knee to chest, bridging, hands on table, then hands on chest then hands behind head SLR with R LE Er 30 degrees supine and seated Leg press 40# 60 degrees to 0 degrees weight on heels education in light resistance and positioning with R knee flexion maintaining less than 90  Standing B plantarflexor stretch , with heel lifts  Seated B knee flexion, B LE concentric, eccentric raising with R   Sidelying R hip clam shells with green t band x 20 reps  07/21/24 Bike level 4  x 5 minutes Nustep level 5 x 5 minutes Slant board stretch HS curls 25# 2x10 Leg press small ROM 40# but left leg with bone on bone crepitus so stopped Feet on ball K2C, rotation, bridges, isometric abs Passive stretch all LE  07/12/24 Evaluation    PATIENT EDUCATION:  Education details: POC/gym Person educated: Patient Education method: Programmer, Multimedia, Facilities Manager, and Verbal cues Education comprehension: verbalized understanding  HOME EXERCISE PROGRAM: Reviewed gym activity, mechanics  ASSESSMENT:  CLINICAL IMPRESSION: Patient is doing well, we addressed post R knee capsule flexibility again  He is going to resume gym activities next week so we went over again safety with weight training, avoiding deep flexion R knee with resistance training, avoiding running. Added soft tissue massage to home routine as well.   Continues to benefit from ongoing PT to transition back to safe gym activities.  Patient is a 73 y.o. male who was seen today for physical therapy evaluation and treatment for right medial meniscus tear.  He does not have bone on bone, he does have some significant joint crepitus on the left and mild on the right, has food ROM, good strength, mild pain.   OBJECTIVE IMPAIRMENTS: Abnormal gait, cardiopulmonary status limiting activity, decreased activity tolerance, decreased balance, decreased endurance, decreased mobility, difficulty walking, decreased ROM, decreased strength, increased edema, impaired flexibility, improper body mechanics, and pain.   REHAB POTENTIAL: Good  CLINICAL DECISION MAKING: Stable/uncomplicated  EVALUATION COMPLEXITY: low   GOALS: Goals reviewed with patient? Yes  SHORT TERM GOALS: Target date: 08/03/24 Independent with advanced HEP Baseline: Goal status: met so far 08/07/24  LONG TERM GOALS: Target date: 10/12/24  Independent with advanced HEP,  gym Baseline:  Goal status: INITIAL  2.  Understand body mechanics to decrease stress on the knees Baseline:  Goal status: INITIAL  3.  Report no pain at rest Baseline:  Goal status: INITIAL  4.  Strength 5/5 with out pain Baseline:  Goal status: INITIAL  PLAN:  PT FREQUENCY: 1x/week  PT DURATION: 12 weeks  PLANNED INTERVENTIONS: 97164- PT Re-evaluation, 97110-Therapeutic exercises, 97530- Therapeutic activity, [id number]- Neuromuscular re-education, 97535- Self Care, 02859- Manual therapy, [id number]- Gait training, [phone]- Electrical stimulation (unattended), 97016- Vasopneumatic device, [id number]- Ultrasound, [id number]- Ionotophoresis 4mg /ml Dexamethasone , Patient/Family education, Balance training, Stair training, Taping, Joint mobilization, Cryotherapy, and Moist heat  PLAN FOR NEXT SESSION: assure safe gym, flexibility   George Gardner, PT, DPT, OCS 08/07/2024, 4:05 PM

## 2024-08-14 ENCOUNTER — Encounter: Payer: Self-pay | Admitting: Physical Therapy

## 2024-08-14 ENCOUNTER — Ambulatory Visit: Admitting: Physical Therapy

## 2024-08-14 DIAGNOSIS — M25661 Stiffness of right knee, not elsewhere classified: Secondary | ICD-10-CM | POA: Insufficient documentation

## 2024-08-14 DIAGNOSIS — R262 Difficulty in walking, not elsewhere classified: Secondary | ICD-10-CM | POA: Insufficient documentation

## 2024-08-14 DIAGNOSIS — M25561 Pain in right knee: Secondary | ICD-10-CM | POA: Diagnosis present

## 2024-08-14 DIAGNOSIS — M6281 Muscle weakness (generalized): Secondary | ICD-10-CM | POA: Insufficient documentation

## 2024-08-14 NOTE — Therapy (Signed)
 OUTPATIENT PHYSICAL THERAPY LOWER EXTREMITY TREATMENT   Patient Name: KARTEL WOLBERT MRN: [id number] DOB:October 28, 1950, 73 y.o., male Today's Date: 08/14/2024  END OF SESSION:  PT End of Session - 08/14/24 1614     Visit Number 5    Date for Recertification  10/12/24    Authorization Type HTA    PT Start Time 1614    PT Stop Time 1700    PT Time Calculation (min) 46 min    Activity Tolerance Patient tolerated treatment well    Behavior During Therapy WFL for tasks assessed/performed            Past Medical History:  Diagnosis Date   Atrial fibrillation (HCC)    Attention deficit disorder    BPH (benign prostatic hyperplasia)    Carpal tunnel syndrome    Complication of anesthesia    shaking uncontrollably, very anxious, pain   Dysrhythmia    never identified with halter monitor   Epididymitis    H/O eye surgery    Hearing loss    History of torn meniscus of left knee    Hypercholesteremia    Impingement syndrome, shoulder, right    OSA (obstructive sleep apnea)    mild-didn't tolerate cpap   Palpitations    Pneumonia    Sleep apnea    Tinnitus    Vitreous detachment of right eye    Past Surgical History:  Procedure Laterality Date   CARPAL TUNNEL RELEASE  09/11/2011   Procedure: CARPAL TUNNEL RELEASE;  Surgeon: Tanda DELENA Heading;  Location: [facility];  Service: Orthopedics;  Laterality: Right;   COLONOSCOPY  10/16/2011   EYE SURGERY  08/15/2011   rt cataract removal w iol implant   FOOT SURGERY  09/14/1969   HERNIA REPAIR Left 1998   again in 2000   TONSILLECTOMY  09/15/1959   yag laser surgery     Patient Active Problem List   Diagnosis Date Noted   Atrial fibrillation (HCC) 06/14/2012   Syncope 06/14/2012   Right carpal tunnel syndrome 09/11/2011    PCP: Rosina Bullock, PA  REFERRING PROVIDER: Cristy, MD  REFERRING DIAG: right medial meniscus tear  THERAPY DIAG:  Acute pain of right knee  Stiffness of right knee, not  elsewhere classified  Difficulty walking  Muscle weakness (generalized)  Rationale for Evaluation and Treatment: Rehabilitation  ONSET DATE: 07/06/24  SUBJECTIVE:   SUBJECTIVE STATEMENT: I am doing okay, I keep getting popping in the knee with the bridge.    Patient reports that he started having right knee pain, he is unsure of a specific cause.  He was doing some running, he is very active and fit, he lifts weights, does a lot of yard work and gardening.  He had an MRI that showed a medial meniscus tear and some arthritis, not bone on bone.  Got injection on 07/06/24, reports that it has helped some  PERTINENT HISTORY: Afib, ADD, BPH, CTS PAIN:  Are you having pain? Yes: NPRS scale: 0/10 Pain location: left medial knee and posterior knee Pain description: ache, sharp Aggravating factors: squatting pain up to 3/10 Relieving factors: the shot has helped, ice and rest pain can be 0/10  PRECAUTIONS: None  RED FLAGS: None   WEIGHT BEARING RESTRICTIONS: No  FALLS:  Has patient fallen in last 6 months? No  LIVING ENVIRONMENT: Lives with: lives with their family Lives in: House/apartment Stairs: Yes: Internal: 10 steps; can reach both Has following equipment at home: None  OCCUPATION: retired  PLOF: Independent and works out daily, some running, a lot of weights, a lot of yard work and gardening  PATIENT GOALS: avoid surgery  NEXT MD VISIT: December 9th  OBJECTIVE:  Note: Objective measures were completed at Evaluation unless otherwise noted.  DIAGNOSTIC FINDINGS: medial meniscus tear   COGNITION: Overall cognitive status: Within functional limits for tasks assessed     SENSATION: WFL  EDEMA:  Medial right knee edema and posterior edema  MUSCLE LENGTH: Mild HS and calf tightness  POSTURE: rounded shoulders and forward head  PALPATION: Medial right joint space is very tender   LOWER EXTREMITY ROM:  WNL's with    LOWER EXTREMITY MMT:4/5 some crepitus  in the knee left knee crepitus is worse than the right    LOWER EXTREMITY SPECIAL TESTS:  Has crepitus bilaterally, good tracking patella  FUNCTIONAL TESTS:  5 times sit to stand: 14 seconds Timed up and go (TUG): 11 seconds  GAIT: Distance walked: 100 feet Assistive device utilized: None Level of assistance: Complete Independence Comments: mild limp on the right with the first 5 steps                                                                                                                                TREATMENT DATE:  08/14/24 Bike level 5 x 5 minutes Slant board stretch Resisted gait 50# all directions Tmill pushes fwd and backward Leg press 20# to 100 degrees  2x10 Passive HS stretch Ktape to support patella and then tried to support medial knee to decrease the popping Reviewed the Gym safety as he is going to go to the gym tomorrow.  08/07/24:  Recumbent cycle: L 4 x 5 min  Supine for  manual distraction of R lower leg at ankle, combined with light quad sets, 5 sec holds, 10 reps Supine R hamstring stretch, pt using Yoga strap , therapist providing overpressure R post knee capsule B heel drops, forefeet on 3 bar 15 -20 sec stretch, B heel raises Leg press B 60#  Supine with 65 cm ball , legs over ball for LTR, B knee to chest, bridging, hands on table, then hands on chest then hands behind head Hamstring curls 35#, 3 x 15  eccentric raises with R LE Theragun massage R medial hamstring tendon   08/02/24: Recumbent bike L 4 x 5 min  Supine for manual distraction of R lower leg at ankle, combined with light quad sets, 5 sec holds, 10 reps R hamstring stretch, therapist providing manual stretch R post knee capsule Supine with 65 cm ball , legs over ball for LTR, B knee to chest, bridging, hands on table, then hands on chest then hands behind head SLR with R LE Er 30 degrees supine and seated Leg press 40# 60 degrees to 0 degrees weight on heels education in light  resistance and positioning with R knee flexion maintaining less than 90  Standing B plantarflexor stretch ,  with heel lifts  Seated B knee flexion, B LE concentric, eccentric raising with R  Sidelying R hip clam shells with green t band x 20 reps  07/21/24 Bike level 4 x 5 minutes Nustep level 5 x 5 minutes Slant board stretch HS curls 25# 2x10 Leg press small ROM 40# but left leg with bone on bone crepitus so stopped Feet on ball K2C, rotation, bridges, isometric abs Passive stretch all LE  07/12/24 Evaluation    PATIENT EDUCATION:  Education details: POC/gym Person educated: Patient Education method: Programmer, Multimedia, Facilities Manager, and Verbal cues Education comprehension: verbalized understanding  HOME EXERCISE PROGRAM: Reviewed gym activity, mechanics  ASSESSMENT:  CLINICAL IMPRESSION: Patient is doing well,He still c/o significant popping in the medial knee going from extension to flexion, the left side does not do this, I tried to block with my hands and cannot stop the popping, I did try tape today to see if it helps.  Continues to benefit from ongoing PT to transition back to safe gym activities.  Patient is a 73 y.o. male who was seen today for physical therapy evaluation and treatment for right medial meniscus tear.  He does not have bone on bone, he does have some significant joint crepitus on the left and mild on the right, has food ROM, good strength, mild pain.   OBJECTIVE IMPAIRMENTS: Abnormal gait, cardiopulmonary status limiting activity, decreased activity tolerance, decreased balance, decreased endurance, decreased mobility, difficulty walking, decreased ROM, decreased strength, increased edema, impaired flexibility, improper body mechanics, and pain.   REHAB POTENTIAL: Good  CLINICAL DECISION MAKING: Stable/uncomplicated  EVALUATION COMPLEXITY: low   GOALS: Goals reviewed with patient? Yes  SHORT TERM GOALS: Target date: 08/03/24 Independent with advanced  HEP Baseline: Goal status: met so far 08/07/24  LONG TERM GOALS: Target date: 10/12/24  Independent with advanced HEP, gym Baseline:  Goal status:progressing 08/14/24  2.  Understand body mechanics to decrease stress on the knees Baseline:  Goal status:progressing 08/14/24  3.  Report no pain at rest Baseline:  Goal status: progressing 08/14/24  4.  Strength 5/5 with out pain Baseline:  Goal status: INITIAL  PLAN:  PT FREQUENCY: 1x/week  PT DURATION: 12 weeks  PLANNED INTERVENTIONS: 97164- PT Re-evaluation, 97110-Therapeutic exercises, 97530- Therapeutic activity, 97112- Neuromuscular re-education, 97535- Self Care, 02859- Manual therapy, [phone]- Gait training, [phone]- Electrical stimulation (unattended), 97016- Vasopneumatic device, [id number]- Ultrasound, 02966- Ionotophoresis 4mg /ml Dexamethasone , Patient/Family education, Balance training, Stair training, Taping, Joint mobilization, Cryotherapy, and Moist heat  PLAN FOR NEXT SESSION: assure safe gym, flexibility   Shterna Laramee W, PT 08/14/2024, 4:16 PM

## 2024-08-22 DIAGNOSIS — M1711 Unilateral primary osteoarthritis, right knee: Secondary | ICD-10-CM | POA: Diagnosis not present

## 2024-08-22 DIAGNOSIS — M23203 Derangement of unspecified medial meniscus due to old tear or injury, right knee: Secondary | ICD-10-CM | POA: Diagnosis not present

## 2024-09-06 ENCOUNTER — Ambulatory Visit: Admitting: Physical Therapy

## 2024-09-06 ENCOUNTER — Encounter: Payer: Self-pay | Admitting: Physical Therapy

## 2024-09-06 DIAGNOSIS — M6281 Muscle weakness (generalized): Secondary | ICD-10-CM

## 2024-09-06 DIAGNOSIS — R262 Difficulty in walking, not elsewhere classified: Secondary | ICD-10-CM

## 2024-09-06 DIAGNOSIS — M25561 Pain in right knee: Secondary | ICD-10-CM | POA: Diagnosis not present

## 2024-09-06 DIAGNOSIS — M25661 Stiffness of right knee, not elsewhere classified: Secondary | ICD-10-CM

## 2024-09-06 NOTE — Therapy (Signed)
 " OUTPATIENT PHYSICAL THERAPY LOWER EXTREMITY TREATMENT   Patient Name: George Gardner MRN: [id number] DOB:[birth year], 73 y.o., male Today's Date: 09/06/2024  END OF SESSION:  PT End of Session - 09/06/24 1144     Visit Number 6    Date for Recertification  10/12/24    Authorization Type HTA    PT Start Time 1145    PT Stop Time 1230    PT Time Calculation (min) 45 min    Activity Tolerance Patient tolerated treatment well    Behavior During Therapy WFL for tasks assessed/performed            Past Medical History:  Diagnosis Date   Atrial fibrillation (HCC)    Attention deficit disorder    BPH (benign prostatic hyperplasia)    Carpal tunnel syndrome    Complication of anesthesia    shaking uncontrollably, very anxious, pain   Dysrhythmia    never identified with halter monitor   Epididymitis    H/O eye surgery    Hearing loss    History of torn meniscus of left knee    Hypercholesteremia    Impingement syndrome, shoulder, right    OSA (obstructive sleep apnea)    mild-didn't tolerate cpap   Palpitations    Pneumonia    Sleep apnea    Tinnitus    Vitreous detachment of right eye    Past Surgical History:  Procedure Laterality Date   CARPAL TUNNEL RELEASE  09/11/2011   Procedure: CARPAL TUNNEL RELEASE;  Surgeon: Tanda DELENA Heading;  Location: [facility];  Service: Orthopedics;  Laterality: Right;   COLONOSCOPY  10/16/2011   EYE SURGERY  08/15/2011   rt cataract removal w iol implant   FOOT SURGERY  09/14/1969   HERNIA REPAIR Left 1998   again in 2000   TONSILLECTOMY  09/15/1959   yag laser surgery     Patient Active Problem List   Diagnosis Date Noted   Atrial fibrillation (HCC) 06/14/2012   Syncope 06/14/2012   Right carpal tunnel syndrome 09/11/2011    PCP: Rosina Bullock, PA  REFERRING PROVIDER: Cristy, MD  REFERRING DIAG: right medial meniscus tear  THERAPY DIAG:  Acute pain of right knee  Stiffness of right knee, not  elsewhere classified  Difficulty walking  Muscle weakness (generalized)  Rationale for Evaluation and Treatment: Rehabilitation  ONSET DATE: 07/06/24  SUBJECTIVE:   SUBJECTIVE STATEMENT: I am doing well, I stopped the gym because I went one time and it really hurt.    Patient reports that he started having right knee pain, he is unsure of a specific cause.  He was doing some running, he is very active and fit, he lifts weights, does a lot of yard work and gardening.  He had an MRI that showed a medial meniscus tear and some arthritis, not bone on bone.  Got injection on 07/06/24, reports that it has helped some  PERTINENT HISTORY: Afib, ADD, BPH, CTS PAIN:  Are you having pain? Yes: NPRS scale: 0/10 Pain location: left medial knee and posterior knee Pain description: ache, sharp Aggravating factors: squatting pain up to 3/10 Relieving factors: the shot has helped, ice and rest pain can be 0/10  PRECAUTIONS: None  RED FLAGS: None   WEIGHT BEARING RESTRICTIONS: No  FALLS:  Has patient fallen in last 6 months? No  LIVING ENVIRONMENT: Lives with: lives with their family Lives in: House/apartment Stairs: Yes: Internal: 10 steps; can reach both Has following equipment at home: None  OCCUPATION: retired  PLOF: Independent and works out daily, some running, a lot of [employer], a lot of yard work and gardening  PATIENT GOALS: avoid surgery  NEXT MD VISIT: December 9th  OBJECTIVE:  Note: Objective measures were completed at Evaluation unless otherwise noted.  DIAGNOSTIC FINDINGS: medial meniscus tear   COGNITION: Overall cognitive status: Within functional limits for tasks assessed     SENSATION: WFL  EDEMA:  Medial right knee edema and posterior edema  MUSCLE LENGTH: Mild HS and calf tightness  POSTURE: rounded shoulders and forward head  PALPATION: Medial right joint space is very tender   LOWER EXTREMITY ROM:  WNL's with    LOWER EXTREMITY MMT:4/5  some crepitus in the knee left knee crepitus is worse than the right    LOWER EXTREMITY SPECIAL TESTS:  Has crepitus bilaterally, good tracking patella  FUNCTIONAL TESTS:  5 times sit to stand: 14 seconds Timed up and go (TUG): 11 seconds  GAIT: Distance walked: 100 feet Assistive device utilized: None Level of assistance: Complete Independence Comments: mild limp on the right with the first 5 steps                                                                                                                                TREATMENT DATE:  09/06/24 Bike Level 5 x 5 minutes Elliptical level 4 x 4 minutes very winded after this Slant board stretch HS curls 35# Leg press small ROM 40# Passive stretch to the LE's all mms  08/14/24 Bike level 5 x 5 minutes Slant board stretch Resisted gait 50# all directions Tmill pushes fwd and backward Leg press 20# to 100 degrees  2x10 Passive HS stretch Ktape to support patella and then tried to support medial knee to decrease the popping Reviewed the Gym safety as he is going to go to the gym tomorrow.  08/07/24:  Recumbent cycle: L 4 x 5 min  Supine for  manual distraction of R lower leg at ankle, combined with light quad sets, 5 sec holds, 10 reps Supine R hamstring stretch, pt using Yoga strap , therapist providing overpressure R post knee capsule B heel drops, forefeet on 3 bar 15 -20 sec stretch, B heel raises Leg press B 60#  Supine with 65 cm ball , legs over ball for LTR, B knee to chest, bridging, hands on table, then hands on chest then hands behind head Hamstring curls 35#, 3 x 15  eccentric raises with R LE Theragun massage R medial hamstring tendon   08/02/24: Recumbent bike L 4 x 5 min  Supine for manual distraction of R lower leg at ankle, combined with light quad sets, 5 sec holds, 10 reps R hamstring stretch, therapist providing manual stretch R post knee capsule Supine with 65 cm ball , legs over ball for LTR, B  knee to chest, bridging, hands on table, then hands on chest then hands behind head SLR with  R LE Er 30 degrees supine and seated Leg press 40# 60 degrees to 0 degrees weight on heels education in light resistance and positioning with R knee flexion maintaining less than 90  Standing B plantarflexor stretch , with heel lifts  Seated B knee flexion, B LE concentric, eccentric raising with R  Sidelying R hip clam shells with green t band x 20 reps  07/21/24 Bike level 4 x 5 minutes Nustep level 5 x 5 minutes Slant board stretch HS curls 25# 2x10 Leg press small ROM 40# but left leg with bone on bone crepitus so stopped Feet on ball K2C, rotation, bridges, isometric abs Passive stretch all LE  07/12/24 Evaluation    PATIENT EDUCATION:  Education details: POC/gym Person educated: Patient Education method: Programmer, Multimedia, Facilities Manager, and Verbal cues Education comprehension: verbalized understanding  HOME EXERCISE PROGRAM: Reviewed gym activity, mechanics  ASSESSMENT:  CLINICAL IMPRESSION: Patient is doing well,he is having much less popping and much less pain.  He is using an iTerra wand at home, he reports doing some stretching.  Continues to benefit from ongoing PT to transition back to safe gym activities.  Patient is a 73 y.o. male who was seen today for physical therapy evaluation and treatment for right medial meniscus tear.  He does not have bone on bone, he does have some significant joint crepitus on the left and mild on the right, has food ROM, good strength, mild pain.   OBJECTIVE IMPAIRMENTS: Abnormal gait, cardiopulmonary status limiting activity, decreased activity tolerance, decreased balance, decreased endurance, decreased mobility, difficulty walking, decreased ROM, decreased strength, increased edema, impaired flexibility, improper body mechanics, and pain.   REHAB POTENTIAL: Good  CLINICAL DECISION MAKING: Stable/uncomplicated  EVALUATION COMPLEXITY:  low   GOALS: Goals reviewed with patient? Yes  SHORT TERM GOALS: Target date: 08/03/24 Independent with advanced HEP Baseline: Goal status: met so far 08/07/24  LONG TERM GOALS: Target date: 10/12/24  Independent with advanced HEP, gym Baseline:  Goal status:progressing 08/14/24  2.  Understand body mechanics to decrease stress on the knees Baseline:  Goal status: met 09/06/24  3.  Report no pain at rest Baseline:  Goal status: progressing 08/14/24  4.  Strength 5/5 with out pain Baseline:  Goal status: progressing 09/06/24  PLAN:  PT FREQUENCY: 1x/week  PT DURATION: 12 weeks  PLANNED INTERVENTIONS: 97164- PT Re-evaluation, 97110-Therapeutic exercises, 97530- Therapeutic activity, 97112- Neuromuscular re-education, 97535- Self Care, 02859- Manual therapy, [phone]- Gait training, [phone]- Electrical stimulation (unattended), 97016- Vasopneumatic device, 97035- Ultrasound, 02966- Ionotophoresis 4mg /ml Dexamethasone , Patient/Family education, Balance training, Stair training, Taping, Joint mobilization, Cryotherapy, and Moist heat  PLAN FOR NEXT SESSION: assure safe gym, flexibility   Jacy Howat W, PT 09/06/2024, 11:47 AM  "

## 2024-09-18 ENCOUNTER — Ambulatory Visit

## 2024-09-25 ENCOUNTER — Ambulatory Visit: Attending: Orthopaedic Surgery

## 2024-09-25 ENCOUNTER — Other Ambulatory Visit: Payer: Self-pay

## 2024-09-25 DIAGNOSIS — M25511 Pain in right shoulder: Secondary | ICD-10-CM | POA: Diagnosis present

## 2024-09-25 DIAGNOSIS — M25661 Stiffness of right knee, not elsewhere classified: Secondary | ICD-10-CM | POA: Insufficient documentation

## 2024-09-25 DIAGNOSIS — G8929 Other chronic pain: Secondary | ICD-10-CM | POA: Insufficient documentation

## 2024-09-25 DIAGNOSIS — M25561 Pain in right knee: Secondary | ICD-10-CM | POA: Diagnosis present

## 2024-09-25 DIAGNOSIS — M6281 Muscle weakness (generalized): Secondary | ICD-10-CM | POA: Diagnosis present

## 2024-09-25 DIAGNOSIS — R262 Difficulty in walking, not elsewhere classified: Secondary | ICD-10-CM | POA: Insufficient documentation

## 2024-09-25 NOTE — Therapy (Unsigned)
 " OUTPATIENT PHYSICAL THERAPY LOWER EXTREMITY TREATMENT   Patient Name: George Gardner MRN: [id number] DOB:01/29/1951, 74 y.o., male Today's Date: 09/26/2024  END OF SESSION:  PT End of Session - 09/25/24 1440     Visit Number 7    Date for Recertification  10/12/24    Progress Note Due on Visit 10    PT Start Time 1435    PT Stop Time 1515    PT Time Calculation (min) 40 min    Activity Tolerance Patient tolerated treatment well    Behavior During Therapy WFL for tasks assessed/performed             Past Medical History:  Diagnosis Date   Atrial fibrillation (HCC)    Attention deficit disorder    BPH (benign prostatic hyperplasia)    Carpal tunnel syndrome    Complication of anesthesia    shaking uncontrollably, very anxious, pain   Dysrhythmia    never identified with halter monitor   Epididymitis    H/O eye surgery    Hearing loss    History of torn meniscus of left knee    Hypercholesteremia    Impingement syndrome, shoulder, right    OSA (obstructive sleep apnea)    mild-didn't tolerate cpap   Palpitations    Pneumonia    Sleep apnea    Tinnitus    Vitreous detachment of right eye    Past Surgical History:  Procedure Laterality Date   CARPAL TUNNEL RELEASE  09/11/2011   Procedure: CARPAL TUNNEL RELEASE;  Surgeon: Tanda DELENA Heading;  Location: [facility];  Service: Orthopedics;  Laterality: Right;   COLONOSCOPY  10/16/2011   EYE SURGERY  08/15/2011   rt cataract removal w iol implant   FOOT SURGERY  09/14/1969   HERNIA REPAIR Left 1998   again in 2000   TONSILLECTOMY  09/15/1959   yag laser surgery     Patient Active Problem List   Diagnosis Date Noted   Atrial fibrillation (HCC) 06/14/2012   Syncope 06/14/2012   Right carpal tunnel syndrome 09/11/2011    PCP: Rosina Bullock, PA  REFERRING PROVIDER: Cristy, MD  REFERRING DIAG: right medial meniscus tear  THERAPY DIAG:  Stiffness of right knee, not elsewhere  classified  Acute pain of right knee  Difficulty walking  Chronic right shoulder pain  Muscle weakness (generalized)  Rationale for Evaluation and Treatment: Rehabilitation  ONSET DATE: 07/06/24  SUBJECTIVE:   SUBJECTIVE STATEMENT: I am doing well, I was ill last week with the crud and had to lie out for a week.  Now my kne is much more comfortable, I've been using an itera wand, on R knee which is helping  Patient reports that he started having right knee pain, he is unsure of a specific cause.  He was doing some running, he is very active and fit, he lifts weights, does a lot of yard work and gardening.  He had an MRI that showed a medial meniscus tear and some arthritis, not bone on bone.  Got injection on 07/06/24, reports that it has helped some  PERTINENT HISTORY: Afib, ADD, BPH, CTS PAIN:  Are you having pain? Yes: NPRS scale: 0/10 Pain location: left medial knee and posterior knee Pain description: ache, sharp Aggravating factors: squatting pain up to 3/10 Relieving factors: the shot has helped, ice and rest pain can be 0/10  PRECAUTIONS: None  RED FLAGS: None   WEIGHT BEARING RESTRICTIONS: No  FALLS:  Has patient fallen in  last 6 months? No  LIVING ENVIRONMENT: Lives with: lives with their family Lives in: House/apartment Stairs: Yes: Internal: 10 steps; can reach both Has following equipment at home: None  OCCUPATION: retired  PLOF: Independent and works out daily, some running, a lot of [employer], a lot of yard work and gardening  PATIENT GOALS: avoid surgery  NEXT MD VISIT: December 9th  OBJECTIVE:  Note: Objective measures were completed at Evaluation unless otherwise noted.  DIAGNOSTIC FINDINGS: medial meniscus tear   COGNITION: Overall cognitive status: Within functional limits for tasks assessed     SENSATION: WFL  EDEMA:  Medial right knee edema and posterior edema  MUSCLE LENGTH: Mild HS and calf tightness  POSTURE: rounded  shoulders and forward head  PALPATION: Medial right joint space is very tender   LOWER EXTREMITY ROM:  WNL's with    LOWER EXTREMITY MMT:4/5 some crepitus in the knee left knee crepitus is worse than the right    LOWER EXTREMITY SPECIAL TESTS:  Has crepitus bilaterally, good tracking patella  FUNCTIONAL TESTS:  5 times sit to stand: 14 seconds Timed up and go (TUG): 11 seconds  GAIT: Distance walked: 100 feet Assistive device utilized: None Level of assistance: Complete Independence Comments: mild limp on the right with the first 5 steps                                                                                                                                TREATMENT DATE:  09/25/24:  Assessment B hamstring flexibility, assessment quads strength, hams strength Recumbent cycle L 4 x 5 min Supine with 75 cm ball for LTR, B knee to chest, bridging Seated for hamstring and plantar fascia stretch with yoga strap Pt to resume gym activities tomorrow, he was able to recite precautions and specific reduced resistance as compare to what he is used to. R Lateral step ups onto 4 step with B hand support, 10 x   09/06/24 Bike Level 5 x 5 minutes Elliptical level 4 x 4 minutes very winded after this Slant board stretch HS curls 35# Leg press small ROM 40# Passive stretch to the LE's all mms  08/14/24 Bike level 5 x 5 minutes Slant board stretch Resisted gait 50# all directions Tmill pushes fwd and backward Leg press 20# to 100 degrees  2x10 Passive HS stretch Ktape to support patella and then tried to support medial knee to decrease the popping Reviewed the Gym safety as he is going to go to the gym tomorrow.  08/07/24:  Recumbent cycle: L 4 x 5 min  Supine for  manual distraction of R lower leg at ankle, combined with light quad sets, 5 sec holds, 10 reps Supine R hamstring stretch, pt using Yoga strap , therapist providing overpressure R post knee capsule B heel  drops, forefeet on 3 bar 15 -20 sec stretch, B heel raises Leg press B 60#  Supine with 65 cm ball ,  legs over ball for LTR, B knee to chest, bridging, hands on table, then hands on chest then hands behind head Hamstring curls 35#, 3 x 15  eccentric raises with R LE Theragun massage R medial hamstring tendon   08/02/24: Recumbent bike L 4 x 5 min  Supine for manual distraction of R lower leg at ankle, combined with light quad sets, 5 sec holds, 10 reps R hamstring stretch, therapist providing manual stretch R post knee capsule Supine with 65 cm ball , legs over ball for LTR, B knee to chest, bridging, hands on table, then hands on chest then hands behind head SLR with R LE Er 30 degrees supine and seated Leg press 40# 60 degrees to 0 degrees weight on heels education in light resistance and positioning with R knee flexion maintaining less than 90  Standing B plantarflexor stretch , with heel lifts  Seated B knee flexion, B LE concentric, eccentric raising with R  Sidelying R hip clam shells with green t band x 20 reps  07/21/24 Bike level 4 x 5 minutes Nustep level 5 x 5 minutes Slant board stretch HS curls 25# 2x10 Leg press small ROM 40# but left leg with bone on bone crepitus so stopped Feet on ball K2C, rotation, bridges, isometric abs Passive stretch all LE  07/12/24 Evaluation    PATIENT EDUCATION:  Education details: POC/gym Person educated: Patient Education method: Programmer, Multimedia, Facilities Manager, and Verbal cues Education comprehension: verbalized understanding  HOME EXERCISE PROGRAM: Reviewed gym activity, mechanics  ASSESSMENT:  CLINICAL IMPRESSION: Patient is doing well, in regard to his knee.  He has been ill for the last 1 and 1/2 weeks and has been resting at home.  His symptoms R knee have nearly resolved.  Hamstrings flexibility and strength B quads, hams wnl.  We discussed discharge.  He wants to return to gym this week and see how he responds, then to return  for one more session in this clinic next week, then dc    Patient is a 74 y.o. male who was seen today for physical therapy evaluation and treatment for right medial meniscus tear.  He does not have bone on bone, he does have some significant joint crepitus on the left and mild on the right, has food ROM, good strength, mild pain.   OBJECTIVE IMPAIRMENTS: Abnormal gait, cardiopulmonary status limiting activity, decreased activity tolerance, decreased balance, decreased endurance, decreased mobility, difficulty walking, decreased ROM, decreased strength, increased edema, impaired flexibility, improper body mechanics, and pain.   REHAB POTENTIAL: Good  CLINICAL DECISION MAKING: Stable/uncomplicated  EVALUATION COMPLEXITY: low   GOALS: Goals reviewed with patient? Yes  SHORT TERM GOALS: Target date: 08/03/24 Independent with advanced HEP Baseline: Goal status: met so far 08/07/24  LONG TERM GOALS: Target date: 10/12/24  Independent with advanced HEP, gym Baseline:  Goal status:progressing 08/14/24  2.  Understand body mechanics to decrease stress on the knees Baseline:  Goal status: met 09/06/24  3.  Report no pain at rest Baseline:  Goal status: progressing 08/14/24  4.  Strength 5/5 with out pain Baseline:  Goal status: progressing 09/06/24  PLAN:  PT FREQUENCY: 1x/week  PT DURATION: 12 weeks  PLANNED INTERVENTIONS: 97164- PT Re-evaluation, 97110-Therapeutic exercises, 97530- Therapeutic activity, 97112- Neuromuscular re-education, 97535- Self Care, 02859- Manual therapy, [id number]- Gait training, [phone]- Electrical stimulation (unattended), 97016- Vasopneumatic device, [id number]- Ultrasound, 02966- Ionotophoresis 4mg /ml Dexamethasone , Patient/Family education, Balance training, Stair training, Taping, Joint mobilization, Cryotherapy, and Moist heat  PLAN FOR NEXT SESSION: assure safe  gym, flexibility   Yevonne Yokum L Dayshon Roback, PT, DPT, OCS 09/26/2024, 5:54 PM  "

## 2024-10-02 ENCOUNTER — Telehealth: Payer: Self-pay | Admitting: Cardiology

## 2024-10-02 ENCOUNTER — Ambulatory Visit

## 2024-10-02 ENCOUNTER — Other Ambulatory Visit: Payer: Self-pay

## 2024-10-02 DIAGNOSIS — M25661 Stiffness of right knee, not elsewhere classified: Secondary | ICD-10-CM | POA: Diagnosis not present

## 2024-10-02 DIAGNOSIS — G8929 Other chronic pain: Secondary | ICD-10-CM

## 2024-10-02 DIAGNOSIS — M25561 Pain in right knee: Secondary | ICD-10-CM

## 2024-10-02 DIAGNOSIS — R262 Difficulty in walking, not elsewhere classified: Secondary | ICD-10-CM

## 2024-10-02 DIAGNOSIS — M6281 Muscle weakness (generalized): Secondary | ICD-10-CM

## 2024-10-02 NOTE — Telephone Encounter (Signed)
 STAT if HR is under 50 or over 120  (normal HR is 60-100 beats per minute)  What is your heart rate? N/A  Do you have a log of your heart rate readings (document readings)? 49,39,37  Do you have any other symptoms? Lightheadedness  Pt states that HR has been running low since last week when he has diagnose with upper respiratory. Please advise

## 2024-10-02 NOTE — Therapy (Signed)
 " OUTPATIENT PHYSICAL THERAPY LOWER EXTREMITY TREATMENT   Patient Name: TRANQUILINO FISCHLER MRN: [id number] DOB:05/18/1951, 74 y.o., male Today's Date: 10/02/2024  END OF SESSION:  PT End of Session - 10/02/24 1443     Visit Number 8    Date for Recertification  10/12/24    Authorization Type HTA    Progress Note Due on Visit 10    PT Start Time 1437    PT Stop Time 1515    PT Time Calculation (min) 38 min    Activity Tolerance Patient tolerated treatment well    Behavior During Therapy WFL for tasks assessed/performed              Past Medical History:  Diagnosis Date   Atrial fibrillation (HCC)    Attention deficit disorder    BPH (benign prostatic hyperplasia)    Carpal tunnel syndrome    Complication of anesthesia    shaking uncontrollably, very anxious, pain   Dysrhythmia    never identified with halter monitor   Epididymitis    H/O eye surgery    Hearing loss    History of torn meniscus of left knee    Hypercholesteremia    Impingement syndrome, shoulder, right    OSA (obstructive sleep apnea)    mild-didn't tolerate cpap   Palpitations    Pneumonia    Sleep apnea    Tinnitus    Vitreous detachment of right eye    Past Surgical History:  Procedure Laterality Date   CARPAL TUNNEL RELEASE  09/11/2011   Procedure: CARPAL TUNNEL RELEASE;  Surgeon: Tanda DELENA Heading;  Location: [facility];  Service: Orthopedics;  Laterality: Right;   COLONOSCOPY  10/16/2011   EYE SURGERY  08/15/2011   rt cataract removal w iol implant   FOOT SURGERY  09/14/1969   HERNIA REPAIR Left 1998   again in 2000   TONSILLECTOMY  09/15/1959   yag laser surgery     Patient Active Problem List   Diagnosis Date Noted   Atrial fibrillation (HCC) 06/14/2012   Syncope 06/14/2012   Right carpal tunnel syndrome 09/11/2011    PCP: Rosina Bullock, PA  REFERRING PROVIDER: Cristy, MD  REFERRING DIAG: right medial meniscus tear  THERAPY DIAG:  Stiffness of right knee,  not elsewhere classified  Acute pain of right knee  Difficulty walking  Chronic right shoulder pain  Muscle weakness (generalized)  Rationale for Evaluation and Treatment: Rehabilitation  ONSET DATE: 07/06/24  SUBJECTIVE:   SUBJECTIVE STATEMENT: I am doing well, no pain in my knee R knee Baker's cyst is not swollen any more   Patient reports that he started having right knee pain, he is unsure of a specific cause.  He was doing some running, he is very active and fit, he lifts weights, does a lot of yard work and gardening.  He had an MRI that showed a medial meniscus tear and some arthritis, not bone on bone.  Got injection on 07/06/24, reports that it has helped some  PERTINENT HISTORY: Afib, ADD, BPH, CTS PAIN:  Are you having pain? Yes: NPRS scale: 0/10 Pain location: left medial knee and posterior knee Pain description: ache, sharp Aggravating factors: squatting pain up to 3/10 Relieving factors: the shot has helped, ice and rest pain can be 0/10  PRECAUTIONS: None  RED FLAGS: None   WEIGHT BEARING RESTRICTIONS: No  FALLS:  Has patient fallen in last 6 months? No  LIVING ENVIRONMENT: Lives with: lives with their family Lives  in: House/apartment Stairs: Yes: Internal: 10 steps; can reach both Has following equipment at home: None  OCCUPATION: retired  PLOF: Independent and works out daily, some running, a lot of [employer], a lot of yard work and gardening  PATIENT GOALS: avoid surgery  NEXT MD VISIT: December 9th  OBJECTIVE:  Note: Objective measures were completed at Evaluation unless otherwise noted.  DIAGNOSTIC FINDINGS: medial meniscus tear   COGNITION: Overall cognitive status: Within functional limits for tasks assessed     SENSATION: WFL  EDEMA:  Medial right knee edema and posterior edema  MUSCLE LENGTH: Mild HS and calf tightness  POSTURE: rounded shoulders and forward head  PALPATION: Medial right joint space is very tender    LOWER EXTREMITY ROM:  WNL's with    LOWER EXTREMITY MMT:4/5 some crepitus in the knee left knee crepitus is worse than the right    LOWER EXTREMITY SPECIAL TESTS:  Has crepitus bilaterally, good tracking patella  FUNCTIONAL TESTS:  5 times sit to stand: 14 seconds Timed up and go (TUG): 11 seconds  GAIT: Distance walked: 100 feet Assistive device utilized: None Level of assistance: Complete Independence Comments: mild limp on the right with the first 5 steps                                                                                                                                TREATMENT DATE:  10/02/24: Recumbent bicycle 8 min, L 4 Supine for manual stretching hamstrings therapist providing overpressure ant knee for capsular stretch  Supine for R plantar flexor stretch with yoga strap Prone with 2 pillows under abdomen for alt hip ext Prone for manual resistance for hip IR / ER In ll bars for modified RDL with rear foot braced on 8 step 10x  09/25/24:  Assessment B hamstring flexibility, assessment quads strength, hams strength Recumbent cycle L 4 x 5 min Supine with 75 cm ball for LTR, B knee to chest, bridging Seated for hamstring and plantar fascia stretch with yoga strap Pt to resume gym activities tomorrow, he was able to recite precautions and specific reduced resistance as compare to what he is used to. R Lateral step ups onto 4 step with B hand support, 10 x   09/06/24 Bike Level 5 x 5 minutes Elliptical level 4 x 4 minutes very winded after this Slant board stretch HS curls 35# Leg press small ROM 40# Passive stretch to the LE's all mms  08/14/24 Bike level 5 x 5 minutes Slant board stretch Resisted gait 50# all directions Tmill pushes fwd and backward Leg press 20# to 100 degrees  2x10 Passive HS stretch Ktape to support patella and then tried to support medial knee to decrease the popping Reviewed the Gym safety as he is going to go to the gym  tomorrow.  08/07/24:  Recumbent cycle: L 4 x 5 min  Supine for  manual distraction of R lower leg at ankle, combined  with light quad sets, 5 sec holds, 10 reps Supine R hamstring stretch, pt using Yoga strap , therapist providing overpressure R post knee capsule B heel drops, forefeet on 3 bar 15 -20 sec stretch, B heel raises Leg press B 60#  Supine with 65 cm ball , legs over ball for LTR, B knee to chest, bridging, hands on table, then hands on chest then hands behind head Hamstring curls 35#, 3 x 15  eccentric raises with R LE Theragun massage R medial hamstring tendon   08/02/24: Recumbent bike L 4 x 5 min  Supine for manual distraction of R lower leg at ankle, combined with light quad sets, 5 sec holds, 10 reps R hamstring stretch, therapist providing manual stretch R post knee capsule Supine with 65 cm ball , legs over ball for LTR, B knee to chest, bridging, hands on table, then hands on chest then hands behind head SLR with R LE Er 30 degrees supine and seated Leg press 40# 60 degrees to 0 degrees weight on heels education in light resistance and positioning with R knee flexion maintaining less than 90  Standing B plantarflexor stretch , with heel lifts  Seated B knee flexion, B LE concentric, eccentric raising with R  Sidelying R hip clam shells with green t band x 20 reps  07/21/24 Bike level 4 x 5 minutes Nustep level 5 x 5 minutes Slant board stretch HS curls 25# 2x10 Leg press small ROM 40# but left leg with bone on bone crepitus so stopped Feet on ball K2C, rotation, bridges, isometric abs Passive stretch all LE  07/12/24 Evaluation    PATIENT EDUCATION:  Education details: POC/gym Person educated: Patient Education method: Programmer, Multimedia, Facilities Manager, and Verbal cues Education comprehension: verbalized understanding  HOME EXERCISE PROGRAM: Reviewed gym activity, mechanics  ASSESSMENT:  CLINICAL IMPRESSION: Patient is doing well.  He was able to complete  one work out routine last week and tolerated very well, with  His symptoms R knee have nearly resolved. He did have to squat and kneel a lot to repair his toilet last week which was taxing on his knees. Reports some issues with lower heart rate over the weekend, will go to the cardiologist this week. He has completed his skilled physical therapy course of care for his R knee at this time.   Patient is a 74 y.o. male who was seen today for physical therapy evaluation and treatment for right medial meniscus tear.  He does not have bone on bone, he does have some significant joint crepitus on the left and mild on the right, has food ROM, good strength, mild pain.   OBJECTIVE IMPAIRMENTS: Abnormal gait, cardiopulmonary status limiting activity, decreased activity tolerance, decreased balance, decreased endurance, decreased mobility, difficulty walking, decreased ROM, decreased strength, increased edema, impaired flexibility, improper body mechanics, and pain.   REHAB POTENTIAL: Good  CLINICAL DECISION MAKING: Stable/uncomplicated  EVALUATION COMPLEXITY: low   GOALS: Goals reviewed with patient? Yes  SHORT TERM GOALS: Target date: 08/03/24 Independent with advanced HEP Baseline: Goal status: met so far 08/07/24  LONG TERM GOALS: Target date: 10/12/24  Independent with advanced HEP, gym Baseline:  Goal status:progressing 08/14/24 10/02/24: met  2.  Understand body mechanics to decrease stress on the knees Baseline:  Goal status: met 09/06/24 10/02/24: met  3.  Report no pain at rest Baseline:  Goal status: progressing 08/14/24 10/02/24: met  4.  Strength 5/5 with out pain Baseline:  Goal status: progressing 09/06/24 10/02/24: met  PLAN:  PT FREQUENCY: 1x/week  PT DURATION: 12 weeks  PLANNED INTERVENTIONS: 97164- PT Re-evaluation, 97110-Therapeutic exercises, 97530- Therapeutic activity, [id number]- Neuromuscular re-education, 97535- Self Care, 02859- Manual therapy, [id number]- Gait  training, [phone]- Electrical stimulation (unattended), 97016- Vasopneumatic device, [id number]- Ultrasound, 02966- Ionotophoresis 4mg /ml Dexamethasone , Patient/Family education, Balance training, Stair training, Taping, Joint mobilization, Cryotherapy, and Moist heat  PLAN FOR NEXT SESSION: DC pt today   Jemery Stacey L Trissa Molina, PT, DPT, OCS 10/02/2024, 4:02 PM  "

## 2024-10-03 ENCOUNTER — Ambulatory Visit

## 2024-10-03 ENCOUNTER — Encounter: Payer: Self-pay | Admitting: Cardiology

## 2024-10-03 ENCOUNTER — Ambulatory Visit: Admitting: Cardiology

## 2024-10-03 VITALS — BP 132/69 | HR 64 | Resp 16 | Ht 70.0 in | Wt 171.0 lb

## 2024-10-03 DIAGNOSIS — R001 Bradycardia, unspecified: Secondary | ICD-10-CM

## 2024-10-03 DIAGNOSIS — I4510 Unspecified right bundle-branch block: Secondary | ICD-10-CM | POA: Diagnosis not present

## 2024-10-03 DIAGNOSIS — I480 Paroxysmal atrial fibrillation: Secondary | ICD-10-CM | POA: Diagnosis not present

## 2024-10-03 NOTE — Patient Instructions (Signed)
 Medication Instructions:  Your physician recommends that you continue on your current medications as directed. Please refer to the Current Medication list given to you today.  *If you need a refill on your cardiac medications before your next appointment, please call your pharmacy*  Lab Work: None ordered If you have labs (blood work) drawn today and your tests are completely normal, you will receive your results only by: MyChart Message (if you have MyChart) OR A paper copy in the mail If you have any lab test that is abnormal or we need to change your treatment, we will call you to review the results.  Testing/Procedures: Echocardiogram  Your physician has requested that you have an echocardiogram. Echocardiography is a painless test that uses sound waves to create images of your heart. It provides your doctor with information about the size and shape of your heart and how well your hearts chambers and valves are working. This procedure takes approximately one hour. There are no restrictions for this procedure. Please do NOT wear cologne, perfume, aftershave, or lotions (deodorant is allowed). Please arrive 15 minutes prior to your appointment time.  Please note: We ask at that you not bring children with you during ultrasound (echo/ vascular) testing. Due to room size and safety concerns, children are not allowed in the ultrasound rooms during exams. Our front office staff cannot provide observation of children in our lobby area while testing is being conducted. An adult accompanying a patient to their appointment will only be allowed in the ultrasound room at the discretion of the ultrasound technician under special circumstances. We apologize for any inconvenience.   1 week Zio Heart Monitor  ZIO XT- Long Term Monitor Instructions  Your physician has requested you wear a ZIO patch monitor for 7 days.  This is a single patch monitor. [employer] supplies one patch monitor per enrollment.  Additional stickers are not available. Please do not apply patch if you will be having a Nuclear Stress Test,  Echocardiogram, Cardiac CT, MRI, or Chest Xray during the period you would be wearing the  monitor. The patch cannot be worn during these tests. You cannot remove and re-apply the  ZIO XT patch monitor.  Your ZIO patch monitor will be mailed 3 day USPS to your address on file. It may take 3-5 days  to receive your monitor after you have been enrolled.  Once you have received your monitor, please review the enclosed instructions. Your monitor  has already been registered assigning a specific monitor serial # to you.  Billing and Patient Assistance Program Information  We have supplied [employer] with any of your insurance information on file for billing purposes. [employer] offers a sliding scale Patient Assistance Program for patients that do not have  insurance, or whose insurance does not completely cover the cost of the ZIO monitor.  You must apply for the Patient Assistance Program to qualify for this discounted rate.  To apply, please call [employer] at [phone], select option 4, select option 2, ask to apply for  Patient Assistance Program. Meredeth will ask your household income, and how many people  are in your household. They will quote your out-of-pocket cost based on that information.  [employer] will also be able to set up a 49-month, interest-free payment plan if needed.  Applying the monitor   Shave hair from upper left chest.  Hold abrader disc by orange tab. Rub abrader in 40 strokes over the upper left chest as  indicated in your monitor instructions.  Clean area with 4 enclosed alcohol pads. Let dry.  Apply patch as indicated in monitor instructions. Patch will be placed under collarbone on left  side of chest with arrow pointing upward.  Rub patch adhesive wings for 2 minutes. Remove white label marked 1. Remove the white  label marked 2. Rub patch adhesive wings  for 2 additional minutes.  While looking in a mirror, press and release button in center of patch. A small green light will  flash 3-4 times. This will be your only indicator that the monitor has been turned on.  Do not shower for the first 24 hours. You may shower after the first 24 hours.  Press the button if you feel a symptom. You will hear a small click. Record Date, Time and  Symptom in the Patient Logbook.  When you are ready to remove the patch, follow instructions on the last 2 pages of Patient  Logbook. Stick patch monitor onto the last page of Patient Logbook.  Place Patient Logbook in the blue and white box. Use locking tab on box and tape box closed  securely. The blue and white box has prepaid postage on it. Please place it in the mailbox as  soon as possible. Your physician should have your test results approximately 7 days after the  monitor has been mailed back to [facility].  Call [facility] Customer Care at [phone] if you have questions regarding  your ZIO XT patch monitor. Call them immediately if you see an orange light blinking on your  monitor.  If your monitor falls off in less than 4 days, contact our Monitor department at [phone].  If your monitor becomes loose or falls off after 4 days call [employer] at [phone] for  suggestions on securing your monitor   Follow-Up: At [facility], you and your health needs are our priority.  As part of our continuing mission to provide you with exceptional heart care, our providers are all part of one team.  This team includes your primary Cardiologist (physician) and Advanced Practice Providers or APPs (Physician Assistants and Nurse Practitioners) who all work together to provide you with the care you need, when you need it.  Your next appointment:   8 week(s)  Provider:   Madonna Large, DO    We recommend signing up for the patient portal called MyChart.  Sign up information is provided on  this After Visit Summary.  MyChart is used to connect with patients for Virtual Visits (Telemedicine).  Patients are able to view lab/test results, encounter notes, upcoming appointments, etc.  Non-urgent messages can be sent to your provider as well.   To learn more about what you can do with MyChart, go to [email or web address].

## 2024-10-03 NOTE — Telephone Encounter (Signed)
 Seen in office today.   Dr. Alanny Rivers

## 2024-10-03 NOTE — Progress Notes (Signed)
 " Cardiology Office Note:  .   ID:  George Gardner, George Gardner 07/19/1951, MRN [id number] PCP:  Nena Rosina LITTIE DEVONNA  Former Cardiology Providers: Dr. Waddell.  [facility] Providers Cardiologist:  Madonna Large, DO, [facility] (established care 03/15/23 )  Electrophysiologist:  Danelle Waddell, MD  Electrophysiologist:  Danelle Waddell, MD  Click to update primary MD,subspecialty MD or APP then REFRESH:1}    Chief Complaint  Patient presents with   Atrial Fibrillation   Follow-up    History of Present Illness: .   George Gardner is a 74 y.o. Caucasian male whose past medical history and cardiovascular risk factors includes: Sleep apnea not on device, paroxysmal atrial fibrillation, right bundle branch block.   Patient has history of paroxysmal atrial fibrillation.    Given his CHA2DS2-VASc SCORE patient is on aspirin  81 mg p.o daily.  He was educated on considering FDA approved technology for monitoring of A-fib burden.  In the past his A-fib episodes were short-lived and he appreciates when he goes in and out of A-fib.  The duration of his A-fib episodes lasted for 5 seconds or less.  He wanted to avoid AV nodal blocking agents.    History of Present Illness   He had an upper respiratory infection around [place]. Since then he has had marked loss of stamina with fatigue and lethargy requiring frequent daytime naps.  For the past 4 to 5 days he has had morning lightheadedness described as swimmy headed brain fog without gait instability, along with persistent fatigue that requires twice-daily naps in addition to nighttime sleep.  Home readings showed heart rates around 50 bpm, lower than his usual 65 to 72 bpm. At urgent care last night (10/02/2024) his heart rate was as low as 43 bpm. Infectious workup was negative, and blood tests and chest x-ray were obtained.  He has right bundle branch block. He denies recent atrial fibrillation, heart failure symptoms, leg swelling, syncope, or  significant dizziness. He takes 81mg  of aspirin  daily.      Review of Systems: .   Review of Systems  Constitutional: Positive for malaise/fatigue.  Cardiovascular:  Negative for chest pain, claudication, irregular heartbeat, leg swelling, near-syncope, orthopnea, palpitations, paroxysmal nocturnal dyspnea and syncope.  Respiratory:  Negative for shortness of breath.   Hematologic/Lymphatic: Negative for bleeding problem.  Neurological:  Positive for dizziness and light-headedness.    Studies Reviewed:   EKG: EKG Interpretation Date/Time:  Tuesday October 03 2024 15:52:15 EST Ventricular Rate:  62 PR Interval:  148 QRS Duration:  142 QT Interval:  450 QTC Calculation: 456 R Axis:   -64  Text Interpretation: Normal sinus rhythm Right bundle branch block Left anterior fascicular block Bifascicular block Minimal voltage criteria for LVH, may be normal variant ( R in aVL ) When compared with ECG of 15-Nov-2023 13:09, No significant change was found Confirmed by Large Madonna [phone]) on 10/03/2024 4:26:38 PM  Echocardiogram: 04/06/2023:  Normal LV systolic function with visual EF 60-65%. Left ventricle cavity is normal in size. Normal left ventricular wall thickness. Normal global  wall motion. Normal diastolic filling pattern, normal LAP.  An atrial septal aneurysm without a patent foramen ovale is present.  No significant valvular heart disease.  No prior study for comparison.      Stress Testing: Exercise nuclear stress test 03/25/2023: Low risk.  See report for additional details  RADIOLOGY: NA  Risk Assessment/Calculations:   Click Here to Calculate/Change CHADS2VASc Score The patient's CHADS2-VASc score is 1, indicating  a 0.6% annual risk of stroke.    Labs:       Latest Ref Rng & Units 02/25/2023   11:59 AM 09/11/2011   12:07 PM  CBC  WBC 4.0 - 10.5 K/uL 6.9    Hemoglobin 13.0 - 17.0 g/dL 84.3  85.5   Hematocrit 39.0 - 52.0 % 46.4    Platelets 150 - 400 K/uL 275          Latest Ref Rng & Units 02/25/2023   11:59 AM  BMP  Glucose 70 - 99 mg/dL 86   BUN 8 - 23 mg/dL 15   Creatinine 9.38 - 1.24 mg/dL 9.04   Sodium 864 - 854 mmol/L 136   Potassium 3.5 - 5.1 mmol/L 3.9   Chloride 98 - 111 mmol/L 104   CO2 22 - 32 mmol/L 25   Calcium 8.9 - 10.3 mg/dL 8.9       Latest Ref Rng & Units 02/25/2023   11:59 AM  CMP  Glucose 70 - 99 mg/dL 86   BUN 8 - 23 mg/dL 15   Creatinine 9.38 - 1.24 mg/dL 9.04   Sodium 864 - 854 mmol/L 136   Potassium 3.5 - 5.1 mmol/L 3.9   Chloride 98 - 111 mmol/L 104   CO2 22 - 32 mmol/L 25   Calcium 8.9 - 10.3 mg/dL 8.9     No results found for: CHOL, HDL, LDLCALC, LDLDIRECT, TRIG, CHOLHDL No results for input(s): LIPOA in the last 8760 hours. No components found for: NTPROBNP No results for input(s): PROBNP in the last 8760 hours. No results for input(s): TSH in the last 8760 hours.  Physical Exam:    Today's Vitals   10/03/24 1547  BP: 132/69  Pulse: 64  Resp: 16  SpO2: 96%  Weight: 171 lb (77.6 kg)  Height: 5' 10 (1.778 m)   Body mass index is 24.54 kg/m. Wt Readings from Last 3 Encounters:  10/03/24 171 lb (77.6 kg)  11/15/23 170 lb 12.8 oz (77.5 kg)  04/26/23 170 lb (77.1 kg)    Orthostatic vital signs Rest: 120/68 pulse of 66. Sitting 123/68 pulse of 67 Standing 113/63 pulse of 72 3-minute standing 111/67 with a pulse of 66  Physical Exam  Constitutional: No distress.  Age appropriate, hemodynamically stable.   HENT:  Hearing aids   Neck: No JVD present.  Cardiovascular: Normal rate, regular rhythm, S1 normal, S2 normal, intact distal pulses and normal pulses. Exam reveals no gallop, no S3 and no S4.  No murmur heard. Pulses:      Dorsalis pedis pulses are 2+ on the right side and 2+ on the left side.       Posterior tibial pulses are 2+ on the right side and 2+ on the left side.  Pulmonary/Chest: Effort normal and breath sounds normal. No stridor. He has no wheezes. He  has no rales.  Abdominal: Soft. Bowel sounds are normal. He exhibits no distension. There is no abdominal tenderness.  Musculoskeletal:        General: No edema.     Cervical back: Neck supple.  Skin: Skin is warm and moist.     Impression & Recommendation(s):  Impression:   ICD-10-CM   1. Paroxysmal atrial fibrillation (HCC)  I48.0 EKG 12-Lead    2. Bradycardia  R00.1 ECHOCARDIOGRAM COMPLETE    LONG TERM MONITOR (3-14 DAYS)    3. RBBB  I45.10        Recommendation(s):  Paroxysmal atrial fibrillation (HCC) Rate control:  N/A Rhythm control: N/A Thromboembolic prophylaxis: Aspirin  Diagnosed with A-fib back in 2013/2014.  Has seen Dr. Waddell in the past. Triggers are usually dehydration or excessive caffeine intake.  Patient endorses that he appreciates when he goes in and out of A-fib and these episodes are very quick.  No underlying EKG or strips during these events to justify it being A-fib.  Bradycardia Asymptomatic. Reassurance provided. Not on AV nodal blocking agents or identifiable reversible cause Patient reports noting a heart rate as low as 43 bpm. Likely secondary to URI, feeling tired fatigue, and daytime sleepiness No near-syncope or syncopal events. Clinically euvolemic and not in heart failure. No anginal chest pain. Labs and chest x-rays performed at recent urgent care visit reviewed.  Outside labs from 10/03/2023 reviewed as part of today's visit Echo will be ordered to evaluate for structural heart disease and left ventricular systolic function. Cardiac monitor to evaluate for dysrhythmias and bradycardia arrhythmias  RBBB Chronic and stable  Orders Placed:  Orders Placed This Encounter  Procedures   LONG TERM MONITOR (3-14 DAYS)    Standing Status:   Future    Number of Occurrences:   1    Expected Date:   10/10/2024    Expiration Date:   10/03/2025    Where should this test be performed?:   [facility]    Does the patient have an implanted  cardiac device?:   No    Prescribed days of wear:   7    Type of enrollment:   Home Enrollment    Vendor::   [employer]    Reason for Exam:   Bradycardia R00.0   EKG 12-Lead   ECHOCARDIOGRAM COMPLETE    Standing Status:   Future    Expected Date:   11/03/2024    Expiration Date:   10/03/2025    [facility]    Does the patient weigh less than or greater than 250 lbs?:   Patient weighs less than 250 lbs    Perflutren DEFINITY (image enhancing agent) should be administered unless hypersensitivity or allergy exist:   Administer Perflutren    Reason for exam-Echo:   Other-Full Diagnosis List    Full ICD-10/Reason for Exam:   Bradycardia [191850]   Medical Decision Making:  Discussed management of at least two chronic comorbid conditions.  I have independently interpreted results of the EKG dated 10/03/2024, labs dated 10/02/2024 , orthostatic vital signs 10/03/2024, and reviewed the results under studies reviewed as part of medical decision making.   I have reviewed the recent urgent care documentation dated 10/02/2024   Patient education provided at today's visit.   Diagnostic testing ordered - see above.  I have independently formulated and discussed the assessment and plan with the patient. This note will be shared with the patient's primary care provider to coordinate care.    Final Medication List:    No orders of the defined types were placed in this encounter.   There are no discontinued medications.   Current Outpatient Medications:    5-Hydroxytryptophan (5-HTP) 50 MG TABS, Take 50 mg by mouth daily., Disp: , Rfl:    acetaminophen  (TYLENOL ) 650 MG CR tablet, Take 650 mg by mouth every 8 (eight) hours as needed for pain., Disp: , Rfl:    Acetylcysteine (NAC 600) 600 MG CAPS, Take 600 mg by mouth daily., Disp: , Rfl:    aspirin  EC 81 MG tablet, Take 1 tablet (81 mg total)  by mouth daily. Swallow whole., Disp: , Rfl:    ASTAXANTHIN PO, Take  10 mg by mouth daily., Disp: , Rfl:    Cholecalciferol (VITAMIN D3 MAXIMUM STRENGTH) 125 MCG (5000 UT) capsule, Take 10,000 Units by mouth daily., Disp: , Rfl:    CRANBERRY PO, Take 10,000 mg by mouth daily., Disp: , Rfl:    L-ARGININE PO, Take 500 mg by mouth daily., Disp: , Rfl:    latanoprost (XALATAN) 0.005 % ophthalmic solution, Place 1 drop into both eyes at bedtime., Disp: , Rfl:    MAGNESIUM MALATE PO, Take 142 mg by mouth daily., Disp: , Rfl:    moxifloxacin (VIGAMOX) 0.5 % ophthalmic solution, Place 1 drop into the right eye 3 (three) times daily., Disp: , Rfl:    OVER THE COUNTER MEDICATION, Take 2 capsules by mouth daily. Infla-650, Disp: , Rfl:    OVER THE COUNTER MEDICATION, Take 1 Scoop by mouth daily. Cardio For Life, Disp: , Rfl:    OVER THE COUNTER MEDICATION, Take 7 drops by mouth daily. Iosol liquid supplement, Disp: , Rfl:    OVER THE COUNTER MEDICATION, Take 1 capsule by mouth daily. Adrenal complex supplement, Disp: , Rfl:    OVER THE COUNTER MEDICATION, Take 3 capsules by mouth daily. Cyruta plus supplement, Disp: , Rfl:    OVER THE COUNTER MEDICATION, Take 3 capsules by mouth daily. A-F betafood supplement, Disp: , Rfl:    Probiotic Product (PROBIOTIC PO), Take 2 capsules by mouth daily., Disp: , Rfl:    sodium chloride (OCEAN) 0.65 % SOLN nasal spray, Place 1 spray into both nostrils as needed for congestion., Disp: , Rfl:    Testosterone  20.25 MG/ACT (1.62%) GEL, Apply 5 Pump topically daily., Disp: , Rfl:    Testosterone  Undecanoate 198 MG CAPS, Take 1 capsule by mouth 2 (two) times daily., Disp: , Rfl:    VITAMIN K PO, Take 1 capsule by mouth daily., Disp: , Rfl:    Zinc 50 MG TABS, Take 1 tablet by mouth daily., Disp: , Rfl:    zinc gluconate 50 MG tablet, Take 50 mg by mouth daily., Disp: , Rfl:   Consent:   NA  Disposition:   8-week follow-up.  His questions and concerns were addressed to his satisfaction. He voices understanding of the recommendations  provided during this encounter.    Signed, Madonna Michele HAS, [facility]  A Division of [facility] [address] [place], [place] [address]   "

## 2024-10-03 NOTE — Telephone Encounter (Signed)
 S/w the pt; he reports that he is getting over a respiratory virus. While fighting this virus he noticed that he has had to sleep a lot, his heart rate and BP has been running low and feeling light-headed/foggy-brained at times. He feels like he is mostly better, but continues with low heart rate and light-headedness.  He went to the Urgent Care yesterday. They did an EKG, tested for covid, strep, mono, uti, lab work, lungs were clear, chest x-ray clear. All tests were negative. Info in Care Everywhere  Hr now is 58 and O2= 94 BP= 116/69  EKG showed at the Urgent Care: Sinus bradycardia  Right bundle branch block  Left anterior fascicular block  Bifascicular block  Left ventricular enlargement   His biggest concern is low heart rate and some dizziness and foggy-brained- mostly when he gets up. He reports that the dizziness lingers after getting up and moving around. His lowest heart rate was 43, but seems to be baseline in the low 50's.   He also reports that he was recently diagnosed with BPH and he was given tamsulosin to take. He only took it a few times, but d/c'd due to having to go to the bathroom so much. He is wanting to be seen by Dr Michele asap.  Appt made for today at 3:40- Aware of location

## 2024-10-03 NOTE — Addendum Note (Signed)
 Addended by: DAVEE IZETTA CROME on: 10/03/2024 10:37 AM   Modules accepted: Orders

## 2024-10-03 NOTE — Telephone Encounter (Signed)
 Pt returning call to a nurse

## 2024-10-03 NOTE — Telephone Encounter (Signed)
 Left message to call back

## 2024-11-15 ENCOUNTER — Ambulatory Visit (HOSPITAL_COMMUNITY)

## 2024-12-05 ENCOUNTER — Ambulatory Visit: Admitting: Cardiology
